# Patient Record
Sex: Male | Born: 1945 | Race: White | Hispanic: No | Marital: Married | Smoking: Never smoker
Health system: Southern US, Community
[De-identification: ages and names within clinical notes are randomized; demographics above are authoritative.]

## PROBLEM LIST (undated history)

## (undated) DIAGNOSIS — M1990 Unspecified osteoarthritis, unspecified site: Secondary | ICD-10-CM

## (undated) DIAGNOSIS — R609 Edema, unspecified: Secondary | ICD-10-CM

## (undated) DIAGNOSIS — D473 Essential (hemorrhagic) thrombocythemia: Secondary | ICD-10-CM

## (undated) DIAGNOSIS — D649 Anemia, unspecified: Secondary | ICD-10-CM

## (undated) DIAGNOSIS — H409 Unspecified glaucoma: Secondary | ICD-10-CM

## (undated) DIAGNOSIS — K219 Gastro-esophageal reflux disease without esophagitis: Secondary | ICD-10-CM

## (undated) DIAGNOSIS — C6290 Malignant neoplasm of unspecified testis, unspecified whether descended or undescended: Secondary | ICD-10-CM

## (undated) DIAGNOSIS — I10 Essential (primary) hypertension: Secondary | ICD-10-CM

## (undated) DIAGNOSIS — K2270 Barrett's esophagus without dysplasia: Secondary | ICD-10-CM

## (undated) DIAGNOSIS — R011 Cardiac murmur, unspecified: Secondary | ICD-10-CM

## (undated) HISTORY — DX: Anemia, unspecified: D64.9

## (undated) HISTORY — DX: Malignant neoplasm of unspecified testis, unspecified whether descended or undescended: C62.90

## (undated) HISTORY — DX: Unspecified glaucoma: H40.9

## (undated) HISTORY — PX: ORCHIECTOMY: SHX2116

## (undated) HISTORY — DX: Edema, unspecified: R60.9

## (undated) HISTORY — DX: Essential (hemorrhagic) thrombocythemia: D47.3

## (undated) HISTORY — PX: CHOLECYSTECTOMY: SHX55

## (undated) HISTORY — DX: Gastro-esophageal reflux disease without esophagitis: K21.9

---

## 2004-01-09 ENCOUNTER — Encounter: Payer: Self-pay | Admitting: Unknown Physician Specialty

## 2004-02-05 ENCOUNTER — Encounter: Payer: Self-pay | Admitting: Unknown Physician Specialty

## 2004-03-07 ENCOUNTER — Encounter: Payer: Self-pay | Admitting: Unknown Physician Specialty

## 2004-04-07 ENCOUNTER — Encounter: Payer: Self-pay | Admitting: Unknown Physician Specialty

## 2004-05-05 ENCOUNTER — Encounter: Payer: Self-pay | Admitting: Unknown Physician Specialty

## 2004-06-05 ENCOUNTER — Encounter: Payer: Self-pay | Admitting: Unknown Physician Specialty

## 2004-07-05 ENCOUNTER — Encounter: Payer: Self-pay | Admitting: Unknown Physician Specialty

## 2004-08-05 ENCOUNTER — Encounter: Payer: Self-pay | Admitting: Unknown Physician Specialty

## 2004-09-04 ENCOUNTER — Encounter: Payer: Self-pay | Admitting: Unknown Physician Specialty

## 2004-10-05 ENCOUNTER — Encounter: Payer: Self-pay | Admitting: Unknown Physician Specialty

## 2004-11-05 ENCOUNTER — Encounter: Payer: Self-pay | Admitting: Unknown Physician Specialty

## 2004-12-05 ENCOUNTER — Encounter: Payer: Self-pay | Admitting: Unknown Physician Specialty

## 2005-01-05 ENCOUNTER — Encounter: Payer: Self-pay | Admitting: Unknown Physician Specialty

## 2005-02-04 ENCOUNTER — Encounter: Payer: Self-pay | Admitting: Unknown Physician Specialty

## 2005-03-01 ENCOUNTER — Ambulatory Visit: Payer: Self-pay | Admitting: Unknown Physician Specialty

## 2005-03-07 ENCOUNTER — Encounter: Payer: Self-pay | Admitting: Unknown Physician Specialty

## 2005-04-07 ENCOUNTER — Encounter: Payer: Self-pay | Admitting: Unknown Physician Specialty

## 2005-05-05 ENCOUNTER — Encounter: Payer: Self-pay | Admitting: Unknown Physician Specialty

## 2005-06-05 ENCOUNTER — Encounter: Payer: Self-pay | Admitting: Unknown Physician Specialty

## 2005-07-05 ENCOUNTER — Encounter: Payer: Self-pay | Admitting: Unknown Physician Specialty

## 2005-08-05 ENCOUNTER — Encounter: Payer: Self-pay | Admitting: Unknown Physician Specialty

## 2005-09-04 ENCOUNTER — Encounter: Payer: Self-pay | Admitting: Unknown Physician Specialty

## 2006-03-08 ENCOUNTER — Ambulatory Visit: Payer: Self-pay | Admitting: Nurse Practitioner

## 2008-11-11 ENCOUNTER — Ambulatory Visit: Payer: Self-pay | Admitting: Unknown Physician Specialty

## 2010-07-29 ENCOUNTER — Ambulatory Visit: Payer: Self-pay | Admitting: Internal Medicine

## 2010-08-06 ENCOUNTER — Ambulatory Visit: Payer: Self-pay | Admitting: Internal Medicine

## 2010-09-05 ENCOUNTER — Ambulatory Visit: Payer: Self-pay | Admitting: Internal Medicine

## 2010-10-06 ENCOUNTER — Ambulatory Visit: Payer: Self-pay | Admitting: Internal Medicine

## 2010-10-15 ENCOUNTER — Ambulatory Visit: Payer: Self-pay | Admitting: Unknown Physician Specialty

## 2010-11-06 ENCOUNTER — Ambulatory Visit: Payer: Self-pay | Admitting: Internal Medicine

## 2010-12-06 ENCOUNTER — Ambulatory Visit: Payer: Self-pay | Admitting: Internal Medicine

## 2011-01-06 ENCOUNTER — Ambulatory Visit: Payer: Self-pay | Admitting: Internal Medicine

## 2011-02-05 ENCOUNTER — Ambulatory Visit: Payer: Self-pay | Admitting: Internal Medicine

## 2011-02-15 ENCOUNTER — Ambulatory Visit: Payer: Self-pay | Admitting: Internal Medicine

## 2011-03-08 ENCOUNTER — Ambulatory Visit: Payer: Self-pay | Admitting: Internal Medicine

## 2011-04-08 ENCOUNTER — Ambulatory Visit: Payer: Self-pay | Admitting: Internal Medicine

## 2011-04-13 LAB — CBC CANCER CENTER
Basophil #: 0.2 x10 3/mm — ABNORMAL HIGH (ref 0.0–0.1)
Basophil %: 3.3 %
Eosinophil #: 0.2 x10 3/mm (ref 0.0–0.7)
Eosinophil %: 2.7 %
HCT: 40.3 % (ref 40.0–52.0)
Lymphocyte #: 1.2 x10 3/mm (ref 1.0–3.6)
MCHC: 34 g/dL (ref 32.0–36.0)
MCV: 110 fL — ABNORMAL HIGH (ref 80–100)
Monocyte #: 0.7 x10 3/mm (ref 0.0–0.7)
Platelet: 427 x10 3/mm (ref 150–440)
RBC: 3.67 10*6/uL — ABNORMAL LOW (ref 4.40–5.90)
RDW: 14.6 % — ABNORMAL HIGH (ref 11.5–14.5)
WBC: 6.6 x10 3/mm (ref 3.8–10.6)

## 2011-05-06 ENCOUNTER — Ambulatory Visit: Payer: Self-pay | Admitting: Internal Medicine

## 2011-05-19 LAB — CBC CANCER CENTER
Basophil %: 0.8 %
Basophil: 1 %
Eosinophil #: 0.1 x10 3/mm (ref 0.0–0.7)
Eosinophil %: 2.5 %
HGB: 13.6 g/dL (ref 13.0–18.0)
Lymphocyte #: 0.9 x10 3/mm — ABNORMAL LOW (ref 1.0–3.6)
MCH: 37.5 pg — ABNORMAL HIGH (ref 26.0–34.0)
MCV: 109.5 fL — ABNORMAL HIGH (ref 80–100)
Monocyte #: 0.4 x10 3/mm (ref 0.0–0.7)
Monocyte %: 8.1 %
Monocytes: 10 %
Platelet: 430 x10 3/mm (ref 150–440)
RBC: 3.64 10*6/uL — ABNORMAL LOW (ref 4.40–5.90)
Segmented Neutrophils: 64 %
WBC: 4.8 x10 3/mm (ref 3.8–10.6)

## 2011-05-19 LAB — HEPATIC FUNCTION PANEL A (ARMC)
Alkaline Phosphatase: 82 U/L (ref 50–136)
Bilirubin,Total: 3.3 mg/dL — ABNORMAL HIGH (ref 0.2–1.0)
SGOT(AST): 12 U/L — ABNORMAL LOW (ref 15–37)
SGPT (ALT): 25 U/L
Total Protein: 6.5 g/dL (ref 6.4–8.2)

## 2011-06-06 ENCOUNTER — Ambulatory Visit: Payer: Self-pay | Admitting: Internal Medicine

## 2011-07-06 ENCOUNTER — Ambulatory Visit: Payer: Self-pay | Admitting: Internal Medicine

## 2011-08-11 ENCOUNTER — Ambulatory Visit: Payer: Self-pay | Admitting: Internal Medicine

## 2011-09-05 ENCOUNTER — Ambulatory Visit: Payer: Self-pay | Admitting: Internal Medicine

## 2011-10-06 ENCOUNTER — Ambulatory Visit: Payer: Self-pay | Admitting: Internal Medicine

## 2011-11-03 LAB — IRON AND TIBC
Iron Bind.Cap.(Total): 268 ug/dL (ref 250–450)
Iron: 108 ug/dL (ref 65–175)
Unbound Iron-Bind.Cap.: 160 ug/dL

## 2011-11-03 LAB — CBC CANCER CENTER
Basophil #: 0.1 x10 3/mm (ref 0.0–0.1)
Eosinophil #: 0.2 x10 3/mm (ref 0.0–0.7)
HGB: 14.5 g/dL (ref 13.0–18.0)
Lymphocyte #: 1.1 x10 3/mm (ref 1.0–3.6)
Lymphocyte %: 13.3 %
MCH: 38.3 pg — ABNORMAL HIGH (ref 26.0–34.0)
MCHC: 36.1 g/dL — ABNORMAL HIGH (ref 32.0–36.0)
MCV: 106 fL — ABNORMAL HIGH (ref 80–100)
Monocyte #: 0.7 x10 3/mm (ref 0.2–1.0)
Neutrophil %: 73.6 %
Platelet: 453 x10 3/mm — ABNORMAL HIGH (ref 150–440)
RDW: 15.1 % — ABNORMAL HIGH (ref 11.5–14.5)

## 2011-11-06 ENCOUNTER — Ambulatory Visit: Payer: Self-pay | Admitting: Internal Medicine

## 2012-04-26 ENCOUNTER — Ambulatory Visit: Payer: Self-pay | Admitting: Internal Medicine

## 2012-05-03 LAB — CBC CANCER CENTER
Eosinophil: 2 %
HCT: 38.2 % — ABNORMAL LOW (ref 40.0–52.0)
Lymphocytes: 7 %
MCH: 36.8 pg — ABNORMAL HIGH (ref 26.0–34.0)
MCHC: 35.8 g/dL (ref 32.0–36.0)
MCV: 103 fL — ABNORMAL HIGH (ref 80–100)
Platelet: 558 x10 3/mm — ABNORMAL HIGH (ref 150–440)
RDW: 15.3 % — ABNORMAL HIGH (ref 11.5–14.5)
Segmented Neutrophils: 85 %
WBC: 11.4 x10 3/mm — ABNORMAL HIGH (ref 3.8–10.6)

## 2012-05-03 LAB — IRON AND TIBC
Iron Bind.Cap.(Total): 287 ug/dL (ref 250–450)
Iron Saturation: 17 %
Unbound Iron-Bind.Cap.: 239 ug/dL

## 2012-05-03 LAB — HEPATIC FUNCTION PANEL A (ARMC)
Albumin: 4.5 g/dL (ref 3.4–5.0)
Alkaline Phosphatase: 77 U/L (ref 50–136)
SGOT(AST): 12 U/L — ABNORMAL LOW (ref 15–37)
SGPT (ALT): 21 U/L (ref 12–78)
Total Protein: 7.3 g/dL (ref 6.4–8.2)

## 2012-05-05 ENCOUNTER — Ambulatory Visit: Payer: Self-pay | Admitting: Internal Medicine

## 2012-07-18 ENCOUNTER — Ambulatory Visit: Payer: Self-pay | Admitting: Unknown Physician Specialty

## 2012-10-25 ENCOUNTER — Ambulatory Visit: Payer: Self-pay | Admitting: Internal Medicine

## 2012-10-25 LAB — CREATININE, SERUM
EGFR (African American): >60
EGFR (Non-African Amer.): >60

## 2012-10-25 LAB — CBC CANCER CENTER
Basophil #: 0.1 x10 3/mm (ref 0.0–0.1)
Basophil %: 1.2 %
Eosinophil #: 0.2 x10 3/mm (ref 0.0–0.7)
Eosinophil %: 2.1 %
HCT: 39 % — ABNORMAL LOW (ref 40.0–52.0)
HGB: 13.9 g/dL (ref 13.0–18.0)
MCH: 35.8 pg — ABNORMAL HIGH (ref 26.0–34.0)
Monocyte #: 0.9 x10 3/mm (ref 0.2–1.0)
Monocyte %: 10.2 %
Neutrophil %: 71.9 %
RBC: 3.88 10*6/uL — ABNORMAL LOW (ref 4.40–5.90)

## 2012-10-25 LAB — HEPATIC FUNCTION PANEL A (ARMC)
Bilirubin,Total: 2.5 mg/dL — ABNORMAL HIGH (ref 0.2–1.0)
SGOT(AST): 15 U/L (ref 15–37)
SGPT (ALT): 24 U/L (ref 12–78)
Total Protein: 6.7 g/dL (ref 6.4–8.2)

## 2012-10-25 LAB — IRON AND TIBC
Iron Bind.Cap.(Total): 282 ug/dL (ref 250–450)
Iron Saturation: 58 %
Iron: 164 ug/dL (ref 65–175)

## 2012-10-25 LAB — FERRITIN: Ferritin (ARMC): 248 ng/mL (ref 8–388)

## 2012-11-05 ENCOUNTER — Ambulatory Visit: Payer: Self-pay | Admitting: Internal Medicine

## 2012-12-26 ENCOUNTER — Ambulatory Visit: Payer: Self-pay | Admitting: Unknown Physician Specialty

## 2012-12-31 LAB — PATHOLOGY REPORT

## 2013-01-17 ENCOUNTER — Ambulatory Visit: Payer: Self-pay | Admitting: Internal Medicine

## 2013-01-17 LAB — CBC CANCER CENTER
Basophil %: 1.3 %
Eosinophil #: 0.2 x10 3/mm (ref 0.0–0.7)
Eosinophil %: 2.5 %
Lymphocyte #: 1.3 x10 3/mm (ref 1.0–3.6)
Lymphocyte %: 16.7 %
MCHC: 34.9 g/dL (ref 32.0–36.0)
Monocyte #: 0.7 x10 3/mm (ref 0.2–1.0)
Neutrophil #: 5.7 x10 3/mm (ref 1.4–6.5)
Platelet: 592 x10 3/mm — ABNORMAL HIGH (ref 150–440)
RBC: 3.82 10*6/uL — ABNORMAL LOW (ref 4.40–5.90)
RDW: 15.4 % — ABNORMAL HIGH (ref 11.5–14.5)

## 2013-01-17 LAB — IRON AND TIBC
Iron Bind.Cap.(Total): 272 ug/dL (ref 250–450)
Iron Saturation: 32 %
Iron: 87 ug/dL (ref 65–175)
Unbound Iron-Bind.Cap.: 185 ug/dL

## 2013-01-17 LAB — FERRITIN: Ferritin (ARMC): 251 ng/mL (ref 8–388)

## 2013-01-22 ENCOUNTER — Ambulatory Visit: Payer: Self-pay | Admitting: Unknown Physician Specialty

## 2013-02-04 ENCOUNTER — Ambulatory Visit: Payer: Self-pay | Admitting: Internal Medicine

## 2013-04-11 ENCOUNTER — Ambulatory Visit: Payer: Self-pay | Admitting: Internal Medicine

## 2013-04-11 LAB — CBC CANCER CENTER
BASOS PCT: 0.5 %
Basophil #: 0 x10 3/mm (ref 0.0–0.1)
EOS ABS: 0.2 x10 3/mm (ref 0.0–0.7)
Eosinophil %: 3.4 %
HCT: 40.7 % (ref 40.0–52.0)
HGB: 14.4 g/dL (ref 13.0–18.0)
Lymphocyte #: 1.2 x10 3/mm (ref 1.0–3.6)
Lymphocyte %: 16.1 %
MCH: 36 pg — AB (ref 26.0–34.0)
MCHC: 35.5 g/dL (ref 32.0–36.0)
MCV: 101 fL — AB (ref 80–100)
MONO ABS: 0.7 x10 3/mm (ref 0.2–1.0)
Monocyte %: 9.2 %
Neutrophil #: 5.1 x10 3/mm (ref 1.4–6.5)
Neutrophil %: 70.8 %
Platelet: 733 x10 3/mm — ABNORMAL HIGH (ref 150–440)
RBC: 4.01 10*6/uL — ABNORMAL LOW (ref 4.40–5.90)
RDW: 15.9 % — ABNORMAL HIGH (ref 11.5–14.5)
WBC: 7.2 x10 3/mm (ref 3.8–10.6)

## 2013-04-11 LAB — IRON AND TIBC
IRON BIND. CAP.(TOTAL): 301 ug/dL (ref 250–450)
IRON: 257 ug/dL — AB (ref 65–175)
Iron Saturation: 85 %
UNBOUND IRON-BIND. CAP.: 44 ug/dL

## 2013-04-11 LAB — FERRITIN: FERRITIN (ARMC): 400 ng/mL — AB (ref 8–388)

## 2013-05-05 ENCOUNTER — Ambulatory Visit: Payer: Self-pay | Admitting: Internal Medicine

## 2013-05-23 LAB — CBC CANCER CENTER
BASOS PCT: 1.5 %
Basophil #: 0.1 x10 3/mm (ref 0.0–0.1)
EOS PCT: 2.3 %
Eosinophil #: 0.2 x10 3/mm (ref 0.0–0.7)
HCT: 40.5 % (ref 40.0–52.0)
HGB: 14.3 g/dL (ref 13.0–18.0)
LYMPHS ABS: 1.2 x10 3/mm (ref 1.0–3.6)
LYMPHS PCT: 13.1 %
MCH: 36.1 pg — AB (ref 26.0–34.0)
MCHC: 35.4 g/dL (ref 32.0–36.0)
MCV: 102 fL — ABNORMAL HIGH (ref 80–100)
MONO ABS: 0.8 x10 3/mm (ref 0.2–1.0)
Monocyte %: 9 %
NEUTROS ABS: 6.8 x10 3/mm — AB (ref 1.4–6.5)
Neutrophil %: 74.1 %
Platelet: 756 x10 3/mm — ABNORMAL HIGH (ref 150–440)
RBC: 3.96 10*6/uL — AB (ref 4.40–5.90)
RDW: 17.1 % — AB (ref 11.5–14.5)
WBC: 9.2 x10 3/mm (ref 3.8–10.6)

## 2013-06-05 ENCOUNTER — Ambulatory Visit: Payer: Self-pay | Admitting: Internal Medicine

## 2013-10-04 ENCOUNTER — Ambulatory Visit: Payer: Self-pay | Admitting: Physical Medicine and Rehabilitation

## 2013-10-10 ENCOUNTER — Ambulatory Visit: Payer: Self-pay | Admitting: Internal Medicine

## 2013-10-23 LAB — CBC CANCER CENTER
Basophil: 2 %
EOS PCT: 4 %
HCT: 39.7 % — ABNORMAL LOW (ref 40.0–52.0)
HGB: 14.1 g/dL (ref 13.0–18.0)
Lymphocytes: 15 %
MCH: 36.3 pg — AB (ref 26.0–34.0)
MCHC: 35.6 g/dL (ref 32.0–36.0)
MCV: 102 fL — AB (ref 80–100)
Monocytes: 7 %
Platelet: 637 x10 3/mm — ABNORMAL HIGH (ref 150–440)
RBC: 3.9 10*6/uL — ABNORMAL LOW (ref 4.40–5.90)
RDW: 15.4 % — AB (ref 11.5–14.5)
Segmented Neutrophils: 72 %
WBC: 8 x10 3/mm (ref 3.8–10.6)

## 2013-10-23 LAB — RETICULOCYTES
ABSOLUTE RETIC COUNT: 0.1873 10*6/uL — AB (ref 0.019–0.186)
RETICULOCYTE: 4.8 % — AB (ref 0.4–3.1)

## 2013-11-05 ENCOUNTER — Ambulatory Visit: Payer: Self-pay | Admitting: Internal Medicine

## 2014-03-13 ENCOUNTER — Ambulatory Visit: Payer: Self-pay | Admitting: Internal Medicine

## 2014-07-22 ENCOUNTER — Telehealth: Payer: Self-pay | Admitting: Internal Medicine

## 2014-07-22 NOTE — Telephone Encounter (Signed)
Patient says platelet count was very high at last checking, 10.28 as of two weeks ago. Before that it was 7.70. He would like an appt to see Pandit. Please advise. [phone].

## 2014-07-23 NOTE — Telephone Encounter (Signed)
Called pt back and he states at his PCP appt labs 2 weeks ago showed plt count 1028.  His PCP wanted him to be seen due the plt inc.  Pt also states that potassium levels have been elevated 5.7 2 weeks ago and 6 months ago 5.8 and PCP told him there was med to help with it but did not prescribe any.  Gave pt appt for Thursday in [place] because that is where pt comes to on his visits and told him 9:30

## 2014-07-24 ENCOUNTER — Inpatient Hospital Stay (HOSPITAL_BASED_OUTPATIENT_CLINIC_OR_DEPARTMENT_OTHER): Payer: Medicare Other | Admitting: Internal Medicine

## 2014-07-24 ENCOUNTER — Other Ambulatory Visit: Payer: Self-pay | Admitting: *Deleted

## 2014-07-24 ENCOUNTER — Inpatient Hospital Stay: Payer: Medicare Other | Attending: Internal Medicine

## 2014-07-24 ENCOUNTER — Encounter: Payer: Self-pay | Admitting: Internal Medicine

## 2014-07-24 VITALS — BP 168/52 | HR 82 | Temp 96.7°F | Resp 18 | Ht 65.0 in | Wt 167.3 lb

## 2014-07-24 DIAGNOSIS — K219 Gastro-esophageal reflux disease without esophagitis: Secondary | ICD-10-CM | POA: Insufficient documentation

## 2014-07-24 DIAGNOSIS — D473 Essential (hemorrhagic) thrombocythemia: Secondary | ICD-10-CM | POA: Insufficient documentation

## 2014-07-24 DIAGNOSIS — Z79899 Other long term (current) drug therapy: Secondary | ICD-10-CM

## 2014-07-24 DIAGNOSIS — D510 Vitamin B12 deficiency anemia due to intrinsic factor deficiency: Secondary | ICD-10-CM | POA: Insufficient documentation

## 2014-07-24 DIAGNOSIS — M8580 Other specified disorders of bone density and structure, unspecified site: Secondary | ICD-10-CM

## 2014-07-24 DIAGNOSIS — Z8547 Personal history of malignant neoplasm of testis: Secondary | ICD-10-CM | POA: Insufficient documentation

## 2014-07-24 DIAGNOSIS — M545 Low back pain: Secondary | ICD-10-CM | POA: Diagnosis not present

## 2014-07-24 LAB — IRON AND TIBC
IRON: 145 ug/dL (ref 45–182)
Saturation Ratios: 56 % — ABNORMAL HIGH (ref 17.9–39.5)
TIBC: 259 ug/dL (ref 250–450)
UIBC: 114 ug/dL

## 2014-07-24 LAB — HEPATIC FUNCTION PANEL
ALK PHOS: 51 U/L (ref 38–126)
ALT: 14 U/L — AB (ref 17–63)
AST: 14 U/L — AB (ref 15–41)
Albumin: 4.6 g/dL (ref 3.5–5.0)
BILIRUBIN DIRECT: 0.2 mg/dL (ref 0.1–0.5)
Indirect Bilirubin: 2.3 mg/dL — ABNORMAL HIGH (ref 0.3–0.9)
Total Bilirubin: 2.5 mg/dL — ABNORMAL HIGH (ref 0.3–1.2)
Total Protein: 6.8 g/dL (ref 6.5–8.1)

## 2014-07-24 LAB — CBC WITH DIFFERENTIAL/PLATELET
Basophils Absolute: 0.1 10*3/uL (ref 0–0.1)
Basophils Relative: 1 %
EOS ABS: 0.3 10*3/uL (ref 0–0.7)
Eosinophils Relative: 3 %
HCT: 41.8 % (ref 40.0–52.0)
Hemoglobin: 14.9 g/dL (ref 13.0–18.0)
LYMPHS ABS: 1.3 10*3/uL (ref 1.0–3.6)
Lymphocytes Relative: 13 %
MCH: 36.1 pg — AB (ref 26.0–34.0)
MCHC: 35.8 g/dL (ref 32.0–36.0)
MCV: 101 fL — ABNORMAL HIGH (ref 80.0–100.0)
Monocytes Absolute: 1 10*3/uL (ref 0.2–1.0)
Monocytes Relative: 10 %
NEUTROS ABS: 7.8 10*3/uL — AB (ref 1.4–6.5)
Neutrophils Relative %: 73 %
PLATELETS: 885 10*3/uL — AB (ref 150–440)
RBC: 4.14 MIL/uL — AB (ref 4.40–5.90)
RDW: 14.6 % — ABNORMAL HIGH (ref 11.5–14.5)
WBC: 10.6 10*3/uL (ref 3.8–10.6)

## 2014-07-24 LAB — CREATININE, SERUM
CREATININE: 0.78 mg/dL (ref 0.61–1.24)
GFR calc Af Amer: >60 mL/min (ref >60)
GFR calc non Af Amer: >60 mL/min (ref >60)

## 2014-07-24 LAB — VITAMIN B12: Vitamin B-12: 894 pg/mL (ref 180–914)

## 2014-07-24 LAB — FERRITIN: FERRITIN: 373 ng/mL — AB (ref 24–336)

## 2014-07-24 NOTE — Progress Notes (Unsigned)
Pt been having sinus infections, off and on with occ. Coughing up colored sputum, no temp. On atb from PCP.

## 2014-07-31 ENCOUNTER — Inpatient Hospital Stay: Payer: Medicare Other

## 2014-07-31 DIAGNOSIS — D473 Essential (hemorrhagic) thrombocythemia: Secondary | ICD-10-CM

## 2014-07-31 LAB — CBC WITH DIFFERENTIAL/PLATELET
BASOS ABS: 0 10*3/uL (ref 0–0.1)
Basophils Relative: 0 %
Eosinophils Absolute: 0.2 10*3/uL (ref 0–0.7)
Eosinophils Relative: 3 %
HCT: 40.5 % (ref 40.0–52.0)
Hemoglobin: 14.6 g/dL (ref 13.0–18.0)
Lymphocytes Relative: 16 %
Lymphs Abs: 1.2 10*3/uL (ref 1.0–3.6)
MCH: 36.6 pg — ABNORMAL HIGH (ref 26.0–34.0)
MCHC: 36 g/dL (ref 32.0–36.0)
MCV: 101.6 fL — ABNORMAL HIGH (ref 80.0–100.0)
MONO ABS: 0.7 10*3/uL (ref 0.2–1.0)
Monocytes Relative: 10 %
Neutro Abs: 5.2 10*3/uL (ref 1.4–6.5)
Platelets: 771 10*3/uL — ABNORMAL HIGH (ref 150–440)
RBC: 3.98 MIL/uL — ABNORMAL LOW (ref 4.40–5.90)
RDW: 15.5 % — ABNORMAL HIGH (ref 11.5–14.5)
WBC: 7.3 10*3/uL (ref 3.8–10.6)

## 2014-08-03 NOTE — Progress Notes (Signed)
[place]  [phone] [phone]     ID: Drew Gardner OB: April 25, 1945  MR#: [id number]  AJG#:[id number]  No care team member to display  CHIEF COMPLAINT/DIAGNOSIS:  1. Macrocytic Anemia, reticulocytosis -  workup showed B12 deficiency/pernicious anemia, otherwise unremarkable. Aug 2014 - [facility] Flow Cytometry study negative.    Workup done May 2012 -  Hb 13, plts 347K, WBC 7200 with 69% neutrophils, 1% myelocytes, 1% metamyelocytes, ab-retic 199. Serum B12 low at 201. Stool Hemoccult negative x2 Liver functions unremarkable except bili of 2.2. Iron study, ESR, LDH, haptoglobin, folate, SIEP, ANA, TSH, T4, direct Coombs' test all unremarkable. June 2012 -  serum Intrinsic factor antibody positive. November 2012 - serum LDH normal, direct Coombs negative.  Haptoglobin low at 33. May 2014 - ultrasound abdomen reported hepatic cysts, prominent spleen of 13.82 cm.  2. 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 10/04/13 - MRI lumbar spine done for low back pain reports extensive marrow signal abnormality worrisome for marrow disorder, along with disc disease. Bone marrow biopsy Aug 2015 myeloproliferative disorder.  Platelet count risen to 1028K on 07/01/14. Patient referred back here for hematology evaluation.              HISTORY OF PRESENT ILLNESS:  patient returns for hematology evaluation , he was recently found to have significantly elevated platelet count from baseline at 1028K on 07/01/14. States he had upper respiratory tract infection about 3-4 weeks ago and took oral antibiotics and  symptoms have almost resolved. Otherwise no new complaints. He is on oral B12. No new dyspnea or orthopnea. Appetite and weight are steady. Chronic back pain is same, denies other new bone pains.  No new paresthesias in extremities. No fevers, chills or night sweats. Denies any bleeding symptoms. Denies h/o thromboembolic  phenomena, states he remains physically active.  REVIEW OF SYSTEMS:   ROS As in HPI above. In addition, no fever, chills or sweats. No new headaches or focal weakness.  No new mood disturbances. No sore throat or dysphagia. No chest pain, dizziness or palpitation. No abdominal pain, constipation, diarrhea, dysuria or hematuria. No new skin rash or bleeding symptoms. No new paresthesias in extremities. No polyuria polydipsia.  PAST MEDICAL HISTORY: Past Medical History  Diagnosis Date  . Anemia   . GERD (gastroesophageal reflux disease)   . Glaucoma   . Testicular cancer     when patient ws 69 years old  . Dependent edema     chronic  . Essential thrombocytosis          Gastroesophageal reflux disease  Chronic dependent edema  History of testicular cancer at age 26 status post right orchiectomy and radiation  History of pernicious anemia currently not on B12 therapy  Glaucoma  Flow murmur  Reported h/o hemochromatosis (per patient, iron levels were high in past and got phlebotomy by GI for months and                   was stopped)             Essential Thrombocytosis, CALR mutation positive                 PAST SURGICAL HISTORY: Past Surgical History  Procedure Laterality Date  . Orchiectomy Right     when he was 69 years old   FAMILY HISTORY: noncontributory, denies malignancy or hematological disorders  ADVANCED DIRECTIVES:   SOCIAL HISTORY: History  Substance Use Topics  .  Smoking status: Never Smoker   . Smokeless tobacco: Never Used  . Alcohol Use: 0.6 oz/week    1 Cans of beer per week     Comment: every once a while not an every week event  Denies history of smoking.  Drinks couple of beers on and off, no regular alcohol intake.  Physically active and ambulatory.  No Known Allergies  Current Outpatient Prescriptions  Medication Sig Dispense Refill  . amoxicillin-clavulanate (AUGMENTIN) 875-125 MG per tablet Take 1 tablet by mouth 2 (two) times daily.    .  hydrochlorothiazide (HYDRODIURIL) 25 MG tablet Take 25 mg by mouth daily.    Marland Kitchen lactobacillus acidophilus (BACID) TABS tablet Take 2 tablets by mouth 3 (three) times daily.    Marland Kitchen latanoprost (XALATAN) 0.005 % ophthalmic solution Place 1 drop into both eyes 2 (two) times daily.    . pantoprazole (PROTONIX) 40 MG tablet Take 40 mg by mouth daily.    . Probiotic Product (PROBIOTIC DAILY PO) Take 1 Dose by mouth daily.    . vitamin B-12 (CYANOCOBALAMIN) 1000 MCG tablet Take 1,000 mcg by mouth daily.    . hydroxyurea (HYDREA) 500 MG capsule Take 500 mg by mouth daily. May take with food to minimize GI side effects.     No current facility-administered medications for this visit.    OBJECTIVE: Filed Vitals:   07/24/14 0944  BP: 168/52  Pulse: 82  Temp: 96.7 F (35.9 C)  Resp: 18     Body mass index is 27.85 kg/(m^2).      GENERAL: Patient is alert and oriented and in no acute distress. There is no icterus. HEENT: EOMs intact. No cervical lymphadenopathy. CVS: S1S2, regular LUNGS: Bilaterally clear to auscultation, no rhonchi. ABDOMEN: Soft, nontender. No hepatosplenomegaly clinically.  NEURO: grossly nonfocal, cranial nerves are intact.   EXTREMITIES: No pedal edema. LYMPHATICS: No palpable adenopathy in axillary or inguinal areas.   LAB RESULTS: WBC 10.6, Hb 14.9, platelets 885K, ANC 7.8, serum iron 145, TIBC 259, ferritin 373, Cr 0.78, LFT as below.    Component Value Date/Time   CREATININE 0.78 07/24/2014 0933   CREATININE 0.90 10/25/2012 1124   PROT 6.8 07/24/2014 0933   PROT 6.7 10/25/2012 1124   ALBUMIN 4.6 07/24/2014 0933   ALBUMIN 4.2 10/25/2012 1124   AST 14* 07/24/2014 0933   AST 15 10/25/2012 1124   ALT 14* 07/24/2014 0933   ALT 24 10/25/2012 1124   ALKPHOS 51 07/24/2014 0933   ALKPHOS 68 10/25/2012 1124   BILITOT 2.5* 07/24/2014 0933   GFRNONAA >60 07/24/2014 0933   GFRNONAA >60 10/25/2012 1124   GFRAA >60 07/24/2014 0933   GFRAA >60 10/25/2012 1124   04/11/13 -  serum B12 normal at 768.  10/04/13 - MRI lumbar spine. IMPRESSION: Extensive marrow signal abnormality is worrisome for multiple myeloma or marrow infiltrative process. Moderate central canal narrowing with narrowing in the right lateral recess and foramen at L3-4 due to a disc bulge, ligamentum flavum thickening and a small synovial cyst off the right facet joint. Moderate central canal stenosis and marked narrowing in the left lateral recess at L4-5 due to disc bulging and facet degenerative change.  07/01/14 - Platelet count risen to 1028K, Hb 15.4, WBC 9700, 50% neutros, 24% lymphs, smudge cells present. Cr 0.7, K+ 5.7.   ASSESSMENT / PLAN:   1. Essential Thrombocytosis, CALR mutation positive -   Prior platelet count mostly 650-760 K. No h/o Thromboembolic phenomena. Platelet count risen to 1028K on 07/01/14.  This could be partly from recent URTI, however, repeat platelet count today is still remarkable high at 885K despite improvement of URTI symptoms. Given this, have recommended pursuing low-dose Hydrea to control platelet count better and explained rationale, possible benefits and possible side effects of Hydrea, he is agreeable and expressed verbal consent to take this treatment. Will start Hydrea 500 mg once daily and closely monitor blood counts with CBC q weekly, intermittent met-B/LFT. Will see him back at 5 weeks with labs and make continued treatment planning. Have recommended taking ECASA 81 mg daily. 2. Pernicious Anemia with reticulocytosis, mild hyperbilirubinemia with negative hemolytic workup -  he continues to have persistent minor abnormalities including slightly elevated serum bilirubin (mostly indirect), mild reticulocytosis, prominent spleen on recent ultrasound of 13.8 cm, low haptoglobin in the past, raising possibility of unexplained low-grade hemolysis.  Recent direct Coombs test and LDH were unremarkable. [facility] flow cytometry done in Aug 2014 also negative. In February 2014 he  had evidence of iron deficiency with low serum iron of 48 and low iron saturation of 17%. Currently on oral B12.  3. Prior MRI lumbar spine done for low back pain reports extensive marrow signal abnormality worrisome for marrow disorder, along with disc disease - marrow signal abnormalities likely from (essential thrombocytosis). 4. In between visits, patient advised to call in case of any new symptoms or acute sickness. He is agreeable to this plan.   Leia Alf, MD   08/03/2014 11:01 AM

## 2014-08-07 ENCOUNTER — Inpatient Hospital Stay: Payer: Medicare Other | Attending: Internal Medicine

## 2014-08-07 DIAGNOSIS — M8580 Other specified disorders of bone density and structure, unspecified site: Secondary | ICD-10-CM | POA: Insufficient documentation

## 2014-08-07 DIAGNOSIS — K219 Gastro-esophageal reflux disease without esophagitis: Secondary | ICD-10-CM | POA: Diagnosis not present

## 2014-08-07 DIAGNOSIS — D510 Vitamin B12 deficiency anemia due to intrinsic factor deficiency: Secondary | ICD-10-CM | POA: Insufficient documentation

## 2014-08-07 DIAGNOSIS — D473 Essential (hemorrhagic) thrombocythemia: Secondary | ICD-10-CM | POA: Diagnosis present

## 2014-08-07 DIAGNOSIS — Z8547 Personal history of malignant neoplasm of testis: Secondary | ICD-10-CM | POA: Insufficient documentation

## 2014-08-07 DIAGNOSIS — D539 Nutritional anemia, unspecified: Secondary | ICD-10-CM | POA: Insufficient documentation

## 2014-08-07 DIAGNOSIS — Z79899 Other long term (current) drug therapy: Secondary | ICD-10-CM | POA: Insufficient documentation

## 2014-08-07 LAB — CBC WITH DIFFERENTIAL/PLATELET
BASOS ABS: 0 10*3/uL (ref 0–0.1)
Basophils Relative: 0 %
Eosinophils Absolute: 0.3 10*3/uL (ref 0–0.7)
HCT: 40.3 % (ref 40.0–52.0)
Hemoglobin: 14.8 g/dL (ref 13.0–18.0)
Lymphs Abs: 1.1 10*3/uL (ref 1.0–3.6)
MCH: 37.4 pg — AB (ref 26.0–34.0)
MCHC: 36.6 g/dL — ABNORMAL HIGH (ref 32.0–36.0)
MCV: 102.2 fL — ABNORMAL HIGH (ref 80.0–100.0)
Monocytes Absolute: 0.7 10*3/uL (ref 0.2–1.0)
Monocytes Relative: 9 %
Neutro Abs: 5.7 10*3/uL (ref 1.4–6.5)
Neutrophils Relative %: 74 %
Platelets: 758 10*3/uL — ABNORMAL HIGH (ref 150–440)
RBC: 3.95 MIL/uL — ABNORMAL LOW (ref 4.40–5.90)
RDW: 16.6 % — ABNORMAL HIGH (ref 11.5–14.5)
WBC: 7.8 10*3/uL (ref 3.8–10.6)

## 2014-08-14 ENCOUNTER — Inpatient Hospital Stay: Payer: Medicare Other

## 2014-08-14 DIAGNOSIS — D473 Essential (hemorrhagic) thrombocythemia: Secondary | ICD-10-CM

## 2014-08-14 LAB — CBC WITH DIFFERENTIAL/PLATELET
BASOS ABS: 0.1 10*3/uL (ref 0–0.1)
Basophils Relative: 2 %
EOS ABS: 0.2 10*3/uL (ref 0–0.7)
HEMATOCRIT: 43 % (ref 40.0–52.0)
Hemoglobin: 15.6 g/dL (ref 13.0–18.0)
LYMPHS ABS: 1.3 10*3/uL (ref 1.0–3.6)
Lymphocytes Relative: 15 %
MCH: 37.7 pg — ABNORMAL HIGH (ref 26.0–34.0)
MCHC: 36.3 g/dL — ABNORMAL HIGH (ref 32.0–36.0)
MCV: 104 fL — ABNORMAL HIGH (ref 80.0–100.0)
MONO ABS: 1 10*3/uL (ref 0.2–1.0)
Monocytes Relative: 11 %
Neutro Abs: 6 10*3/uL (ref 1.4–6.5)
Neutrophils Relative %: 69 %
Platelets: 803 10*3/uL — ABNORMAL HIGH (ref 150–440)
RBC: 4.13 MIL/uL — AB (ref 4.40–5.90)
RDW: 16.9 % — AB (ref 11.5–14.5)
WBC: 8.7 10*3/uL (ref 3.8–10.6)

## 2014-08-14 LAB — POTASSIUM: Potassium: 3.5 mmol/L (ref 3.5–5.1)

## 2014-08-21 ENCOUNTER — Inpatient Hospital Stay: Payer: Medicare Other

## 2014-08-28 ENCOUNTER — Inpatient Hospital Stay: Payer: Medicare Other

## 2014-08-28 ENCOUNTER — Inpatient Hospital Stay (HOSPITAL_BASED_OUTPATIENT_CLINIC_OR_DEPARTMENT_OTHER): Payer: Medicare Other | Admitting: Internal Medicine

## 2014-08-28 VITALS — BP 135/63 | HR 67 | Temp 96.8°F | Resp 18 | Ht 65.0 in | Wt 167.8 lb

## 2014-08-28 DIAGNOSIS — M8580 Other specified disorders of bone density and structure, unspecified site: Secondary | ICD-10-CM | POA: Diagnosis not present

## 2014-08-28 DIAGNOSIS — D539 Nutritional anemia, unspecified: Secondary | ICD-10-CM

## 2014-08-28 DIAGNOSIS — K219 Gastro-esophageal reflux disease without esophagitis: Secondary | ICD-10-CM

## 2014-08-28 DIAGNOSIS — D473 Essential (hemorrhagic) thrombocythemia: Secondary | ICD-10-CM

## 2014-08-28 DIAGNOSIS — D510 Vitamin B12 deficiency anemia due to intrinsic factor deficiency: Secondary | ICD-10-CM

## 2014-08-28 DIAGNOSIS — Z79899 Other long term (current) drug therapy: Secondary | ICD-10-CM

## 2014-08-28 DIAGNOSIS — Z8547 Personal history of malignant neoplasm of testis: Secondary | ICD-10-CM

## 2014-08-28 LAB — HEPATIC FUNCTION PANEL
ALBUMIN: 4.5 g/dL (ref 3.5–5.0)
ALK PHOS: 48 U/L (ref 38–126)
ALT: 21 U/L (ref 17–63)
AST: 16 U/L (ref 15–41)
Bilirubin, Direct: 0.2 mg/dL (ref 0.1–0.5)
Indirect Bilirubin: 3.3 mg/dL — ABNORMAL HIGH (ref 0.3–0.9)
TOTAL PROTEIN: 6.7 g/dL (ref 6.5–8.1)
Total Bilirubin: 3.5 mg/dL — ABNORMAL HIGH (ref 0.3–1.2)

## 2014-08-28 LAB — CBC WITH DIFFERENTIAL/PLATELET
Basophils Absolute: 0.2 10*3/uL — ABNORMAL HIGH (ref 0–0.1)
Basophils Relative: 3 %
EOS ABS: 0.2 10*3/uL (ref 0–0.7)
HCT: 42.3 % (ref 40.0–52.0)
HEMOGLOBIN: 15.1 g/dL (ref 13.0–18.0)
Lymphs Abs: 0.9 10*3/uL — ABNORMAL LOW (ref 1.0–3.6)
MCH: 38.5 pg — AB (ref 26.0–34.0)
MCHC: 35.8 g/dL (ref 32.0–36.0)
MCV: 107.6 fL — AB (ref 80.0–100.0)
Monocytes Absolute: 0.7 10*3/uL (ref 0.2–1.0)
Monocytes Relative: 12 %
Neutro Abs: 3.8 10*3/uL (ref 1.4–6.5)
PLATELETS: 560 10*3/uL — AB (ref 150–440)
RBC: 3.93 MIL/uL — ABNORMAL LOW (ref 4.40–5.90)
RDW: 18.5 % — ABNORMAL HIGH (ref 11.5–14.5)
WBC: 5.8 10*3/uL (ref 3.8–10.6)

## 2014-08-28 LAB — CREATININE, SERUM
Creatinine, Ser: 0.77 mg/dL (ref 0.61–1.24)
GFR calc non Af Amer: >60 mL/min (ref >60)

## 2014-08-28 LAB — POTASSIUM: Potassium: 3.9 mmol/L (ref 3.5–5.1)

## 2014-09-01 NOTE — Progress Notes (Signed)
[facility]  Telephone:(336) [phone] Fax:(336) [phone]     ID: Drew Gardner OB: 1945-11-01  MR#: [id number]  HFW#:[id number]  No care team member to display  CHIEF COMPLAINT/DIAGNOSIS:  1. Macrocytic Anemia, reticulocytosis -  workup showed B12 deficiency/pernicious anemia, otherwise unremarkable. Aug 2014 - [facility] Flow Cytometry study negative.    Workup done May 2012 -  Hb 13, plts 347K, WBC 7200 with 69% neutrophils, 1% myelocytes, 1% metamyelocytes, ab-retic 199. Serum B12 low at 201. Stool Hemoccult negative x2 Liver functions unremarkable except bili of 2.2. Iron study, ESR, LDH, haptoglobin, folate, SIEP, ANA, TSH, T4, direct Coombs' test all unremarkable. June 2012 -  serum Intrinsic factor antibody positive. November 2012 - serum LDH normal, direct Coombs negative.  Haptoglobin low at 33. May 2014 - ultrasound abdomen reported hepatic cysts, prominent spleen of 13.82 cm.  2. Essential Thrombocytosis ([id number]), CALR mutation positive - asymptomatic, on monitoring. 10/04/13 - MRI lumbar spine done for low back pain reports extensive marrow signal abnormality worrisome for marrow disorder, along with disc disease. Bone marrow biopsy Aug 2015 myeloproliferative disorder.  Platelet count risen to 1028K on 07/01/14. Patient referred back here for hematology evaluation.              HISTORY OF PRESENT ILLNESS:  Patient returns for continued hematology followup, he was recently started on Hydrea. States he is tolerating this well and denies new side effects. No mouth sores. No diarrhea. No imbalance or dizziness. Otherwise no new complaints. He is on oral B12. No new dyspnea or orthopnea. Appetite and weight are steady. Chronic back pain is same, denies other new bone pains. No fevers, chills or night sweats. Denies any bleeding symptoms. Denies h/o thromboembolic phenomena, states he remains physically active.  REVIEW OF SYSTEMS:   ROS As in HPI above. In addition, no new  headaches or focal weakness.  No new mood disturbances. No sore throat or dysphagia. No chest pain, dizziness or palpitation. No abdominal pain, constipation, diarrhea, dysuria or hematuria. No new skin rash or bleeding symptoms. No new paresthesias in extremities.   PAST MEDICAL HISTORY: Past Medical History  Diagnosis Date  . Anemia   . GERD (gastroesophageal reflux disease)   . Glaucoma   . Testicular cancer     when patient ws 69 years old  . Dependent edema     chronic  . Essential thrombocytosis          Gastroesophageal reflux disease  Chronic dependent edema  History of testicular cancer at age 40 status post right orchiectomy and radiation  History of pernicious anemia currently not on B12 therapy  Glaucoma  Flow murmur  Reported h/o hemochromatosis (per patient, iron levels high in past, got phlebotomy by GI for months and was stopped)             Essential Thrombocytosis, CALR mutation positive                 PAST SURGICAL HISTORY: Past Surgical History  Procedure Laterality Date  . Orchiectomy Right     when he was 69 years old   FAMILY HISTORY: noncontributory, denies malignancy or hematological disorders  SOCIAL HISTORY: History  Substance Use Topics  . Smoking status: Never Smoker   . Smokeless tobacco: Never Used  . Alcohol Use: 0.6 oz/week    1 Cans of beer per week     Comment: every once a while not an every week event  Denies history of smoking.  Drinks  couple of beers on and off, no regular alcohol intake.  Physically active and ambulatory.  No Known Allergies  Current Outpatient Prescriptions  Medication Sig Dispense Refill  . hydrochlorothiazide (HYDRODIURIL) 25 MG tablet Take 25 mg by mouth daily.    . hydroxyurea (HYDREA) 500 MG capsule Take 500 mg by mouth every other day. May take with food to minimize GI side effects.    . hydroxyurea (HYDREA) 500 MG capsule Take 500 mg by mouth 2 (two) times daily. Every other day.    . lactobacillus  acidophilus (BACID) TABS tablet Take 2 tablets by mouth 3 (three) times daily.    Marland Kitchen latanoprost (XALATAN) 0.005 % ophthalmic solution Place 1 drop into both eyes 2 (two) times daily.    . pantoprazole (PROTONIX) 40 MG tablet Take 40 mg by mouth daily.    . Probiotic Product (PROBIOTIC DAILY PO) Take 1 Dose by mouth daily.    . vitamin B-12 (CYANOCOBALAMIN) 1000 MCG tablet Take 1,000 mcg by mouth daily.    Marland Kitchen amoxicillin-clavulanate (AUGMENTIN) 875-125 MG per tablet Take 1 tablet by mouth 2 (two) times daily.    . hydroxyurea (HYDREA) 500 MG capsule Take 500 mg by mouth daily. May take with food to minimize GI side effects.     No current facility-administered medications for this visit.    OBJECTIVE: Filed Vitals:   08/28/14 1016  BP: 135/63  Pulse: 67  Temp: 96.8 F (36 C)  Resp: 18     Body mass index is 27.92 kg/(m^2).      GENERAL: Alert and oriented and in no acute distress. No icterus. HEENT: EOMs intact. No cervical lymphadenopathy. CVS: S1S2, regular LUNGS: Bilaterally clear to auscultation, no rhonchi. ABDOMEN: Soft, nontender. No hepatosplenomegaly clinically.  NEURO: grossly nonfocal, cranial nerves are intact.   EXTREMITIES: No pedal edema.   LAB RESULTS: WBC 5.8, Hb 15.1, Hct 42.3, platelets 560K, ANC 3.8. May 2016 - .iron sat 56%, serum iron 145, TIBC 259, ferritin 373, Cr 0.78, B12 894, LFT as below.    Component Value Date/Time   K 3.9 08/28/2014 1004   CREATININE 0.77 08/28/2014 1004   CREATININE 0.90 10/25/2012 1124   PROT 6.7 08/28/2014 1004   PROT 6.7 10/25/2012 1124   ALBUMIN 4.5 08/28/2014 1004   ALBUMIN 4.2 10/25/2012 1124   AST 16 08/28/2014 1004   AST 15 10/25/2012 1124   ALT 21 08/28/2014 1004   ALT 24 10/25/2012 1124   ALKPHOS 48 08/28/2014 1004   ALKPHOS 68 10/25/2012 1124   BILITOT 3.5* 08/28/2014 1004   GFRNONAA >60 08/28/2014 1004   GFRNONAA >60 10/25/2012 1124   GFRAA >60 08/28/2014 1004   GFRAA >60 10/25/2012 1124   04/11/13 - serum  B12 normal at 768.  10/04/13 - MRI lumbar spine. IMPRESSION: Extensive marrow signal abnormality is worrisome for multiple myeloma or marrow infiltrative process. Moderate central canal narrowing with narrowing in the right lateral recess and foramen at L3-4 due to a disc bulge, ligamentum flavum thickening and a small synovial cyst off the right facet joint. Moderate central canal stenosis and marked narrowing in the left lateral recess at L4-5 due to disc bulging and facet degenerative change.  07/01/14 - Platelet count risen to 1028K, Hb 15.4, WBC 9700, 50% neutros, 24% lymphs, smudge cells present. Cr 0.7, K+ 5.7.   ASSESSMENT / PLAN:   1. Essential Thrombocytosis, CALR mutation positive. Started on [facility] May 2016 - reviewed labs and d/w patient. He is tolerating Hydrea dose well.Platelet  count better at 560K today. Will continue to titrate dose of Hydrea to keep platelet count close to or within normal range. No h/o Thromboembolic phenomena. Will monitor CBC q 2 weeks, intermittent Cr and LFTs. Will see him back at 12 weeks with labs and make continued treatment planning. Have recommended taking ECASA 81 mg daily. 2. Pernicious Anemia with reticulocytosis, mild hyperbilirubinemia with negative hemolytic workup -  he continues to have persistent minor abnormalities including slightly elevated serum bilirubin (mostly indirect), mild reticulocytosis, prominent spleen on recent ultrasound of 13.8 cm, low haptoglobin in the past, raising possibility of unexplained low-grade hemolysis.  Recent direct Coombs test and LDH were unremarkable. [facility] flow cytometry done in Aug 2014 also negative. In February 2014 he had evidence of iron deficiency with low serum iron of 48 and low iron saturation of 17%. Currently on oral B12, recent serum B12 normal.  3. Prior MRI lumbar spine done for low back pain reports extensive marrow signal abnormality worrisome for marrow disorder, along with disc disease - marrow signal  abnormalities likely from (essential thrombocytosis). 4. In between visits, patient advised to call in case of any new symptoms or acute sickness. He is agreeable to this plan.   Leia Alf, MD   09/01/2014 12:37 PM

## 2014-09-11 ENCOUNTER — Inpatient Hospital Stay: Payer: Medicare Other | Attending: Internal Medicine

## 2014-09-11 DIAGNOSIS — D473 Essential (hemorrhagic) thrombocythemia: Secondary | ICD-10-CM | POA: Diagnosis not present

## 2014-09-11 LAB — CBC WITH DIFFERENTIAL/PLATELET
Basophils Absolute: 0 10*3/uL (ref 0–0.1)
Eosinophils Absolute: 0.2 10*3/uL (ref 0–0.7)
Eosinophils Relative: 3 %
HCT: 41.2 % (ref 40.0–52.0)
Hemoglobin: 15 g/dL (ref 13.0–18.0)
Lymphs Abs: 0.9 10*3/uL — ABNORMAL LOW (ref 1.0–3.6)
MCH: 39.9 pg — AB (ref 26.0–34.0)
MCHC: 36.4 g/dL — AB (ref 32.0–36.0)
MCV: 109.5 fL — AB (ref 80.0–100.0)
Monocytes Absolute: 0.6 10*3/uL (ref 0.2–1.0)
Neutro Abs: 4.7 10*3/uL (ref 1.4–6.5)
Platelets: 528 10*3/uL — ABNORMAL HIGH (ref 150–440)
RBC: 3.76 MIL/uL — ABNORMAL LOW (ref 4.40–5.90)
RDW: 17.2 % — ABNORMAL HIGH (ref 11.5–14.5)
WBC: 6.5 10*3/uL (ref 3.8–10.6)

## 2014-09-11 LAB — HEPATIC FUNCTION PANEL
ALK PHOS: 45 U/L (ref 38–126)
ALT: 22 U/L (ref 17–63)
AST: 19 U/L (ref 15–41)
Albumin: 4.5 g/dL (ref 3.5–5.0)
Bilirubin, Direct: 0.2 mg/dL (ref 0.1–0.5)
Indirect Bilirubin: 2.2 mg/dL — ABNORMAL HIGH (ref 0.3–0.9)
Total Bilirubin: 2.4 mg/dL — ABNORMAL HIGH (ref 0.3–1.2)
Total Protein: 6.4 g/dL — ABNORMAL LOW (ref 6.5–8.1)

## 2014-09-11 LAB — CREATININE, SERUM
Creatinine, Ser: 0.66 mg/dL (ref 0.61–1.24)
GFR calc Af Amer: >60 mL/min (ref >60)

## 2014-09-25 ENCOUNTER — Inpatient Hospital Stay: Payer: Medicare Other

## 2014-09-25 ENCOUNTER — Telehealth: Payer: Self-pay | Admitting: *Deleted

## 2014-09-25 DIAGNOSIS — D473 Essential (hemorrhagic) thrombocythemia: Secondary | ICD-10-CM

## 2014-09-25 LAB — CREATININE, SERUM
Creatinine, Ser: 0.74 mg/dL (ref 0.61–1.24)
GFR calc Af Amer: >60 mL/min
GFR calc non Af Amer: >60 mL/min

## 2014-09-25 LAB — CBC WITH DIFFERENTIAL/PLATELET
BASOS ABS: 0.2 10*3/uL — AB (ref 0–0.1)
Eosinophils Absolute: 0.1 10*3/uL (ref 0–0.7)
HEMATOCRIT: 40.3 % (ref 40.0–52.0)
Hemoglobin: 14.6 g/dL (ref 13.0–18.0)
LYMPHS ABS: 0.8 10*3/uL — AB (ref 1.0–3.6)
Lymphocytes Relative: 16 %
MCH: 40.3 pg — AB (ref 26.0–34.0)
MCHC: 36.2 g/dL — ABNORMAL HIGH (ref 32.0–36.0)
MCV: 111.4 fL — ABNORMAL HIGH (ref 80.0–100.0)
MONO ABS: 0.5 10*3/uL (ref 0.2–1.0)
Monocytes Relative: 11 %
NEUTROS ABS: 3.4 10*3/uL (ref 1.4–6.5)
Neutrophils Relative %: 67 %
PLATELETS: 421 10*3/uL (ref 150–440)
RBC: 3.61 MIL/uL — AB (ref 4.40–5.90)
RDW: 16.8 % — AB (ref 11.5–14.5)
WBC: 5 10*3/uL (ref 3.8–10.6)

## 2014-09-25 LAB — HEPATIC FUNCTION PANEL
ALBUMIN: 4.5 g/dL (ref 3.5–5.0)
ALT: 18 U/L (ref 17–63)
AST: 15 U/L (ref 15–41)
Alkaline Phosphatase: 42 U/L (ref 38–126)
BILIRUBIN INDIRECT: 2.7 mg/dL — AB (ref 0.3–0.9)
Bilirubin, Direct: 0.2 mg/dL (ref 0.1–0.5)
Total Bilirubin: 2.9 mg/dL — ABNORMAL HIGH (ref 0.3–1.2)
Total Protein: 6.5 g/dL (ref 6.5–8.1)

## 2014-09-25 LAB — POTASSIUM: Potassium: 3.5 mmol/L (ref 3.5–5.1)

## 2014-09-25 NOTE — Telephone Encounter (Signed)
Pt contacted and plt count 421.  NP covering for pandit today and stay on same dose which is 2 tablets every other day.

## 2014-10-09 ENCOUNTER — Inpatient Hospital Stay: Payer: Medicare Other | Attending: Internal Medicine

## 2014-10-09 DIAGNOSIS — D510 Vitamin B12 deficiency anemia due to intrinsic factor deficiency: Secondary | ICD-10-CM | POA: Insufficient documentation

## 2014-10-09 DIAGNOSIS — D473 Essential (hemorrhagic) thrombocythemia: Secondary | ICD-10-CM | POA: Insufficient documentation

## 2014-10-09 DIAGNOSIS — D539 Nutritional anemia, unspecified: Secondary | ICD-10-CM | POA: Insufficient documentation

## 2014-10-09 LAB — CBC WITH DIFFERENTIAL/PLATELET
Basophils Absolute: 0.1 10*3/uL (ref 0–0.1)
Eosinophils Absolute: 0.1 10*3/uL (ref 0–0.7)
Eosinophils Relative: 3 %
HCT: 40 % (ref 40.0–52.0)
Hemoglobin: 14.6 g/dL (ref 13.0–18.0)
Lymphocytes Relative: 16 %
Lymphs Abs: 0.8 10*3/uL — ABNORMAL LOW (ref 1.0–3.6)
MCH: 41.5 pg — ABNORMAL HIGH (ref 26.0–34.0)
MCHC: 36.4 g/dL — AB (ref 32.0–36.0)
MCV: 114 fL — AB (ref 80.0–100.0)
MONO ABS: 0.5 10*3/uL (ref 0.2–1.0)
Monocytes Relative: 10 %
NEUTROS ABS: 3.5 10*3/uL (ref 1.4–6.5)
Neutrophils Relative %: 70 %
Platelets: 382 10*3/uL (ref 150–440)
RBC: 3.51 MIL/uL — AB (ref 4.40–5.90)
RDW: 15.6 % — AB (ref 11.5–14.5)
WBC: 5.1 10*3/uL (ref 3.8–10.6)

## 2014-10-23 ENCOUNTER — Inpatient Hospital Stay: Payer: Medicare Other

## 2014-10-23 DIAGNOSIS — D473 Essential (hemorrhagic) thrombocythemia: Secondary | ICD-10-CM

## 2014-10-23 LAB — CBC WITH DIFFERENTIAL/PLATELET
Basophils Absolute: 0.1 10*3/uL (ref 0–0.1)
Eosinophils Absolute: 0.1 10*3/uL (ref 0–0.7)
Eosinophils Relative: 2 %
HEMATOCRIT: 39.4 % — AB (ref 40.0–52.0)
HEMOGLOBIN: 14.3 g/dL (ref 13.0–18.0)
Lymphs Abs: 0.9 10*3/uL — ABNORMAL LOW (ref 1.0–3.6)
MCH: 42.5 pg — ABNORMAL HIGH (ref 26.0–34.0)
MCHC: 36.3 g/dL — ABNORMAL HIGH (ref 32.0–36.0)
MCV: 117.1 fL — AB (ref 80.0–100.0)
MONO ABS: 0.5 10*3/uL (ref 0.2–1.0)
Monocytes Relative: 10 %
NEUTROS ABS: 3.5 10*3/uL (ref 1.4–6.5)
Platelets: 348 10*3/uL (ref 150–440)
RBC: 3.36 MIL/uL — ABNORMAL LOW (ref 4.40–5.90)
RDW: 15.7 % — AB (ref 11.5–14.5)
WBC: 5.1 10*3/uL (ref 3.8–10.6)

## 2014-11-06 ENCOUNTER — Inpatient Hospital Stay: Payer: Medicare Other | Attending: Internal Medicine

## 2014-11-06 DIAGNOSIS — D510 Vitamin B12 deficiency anemia due to intrinsic factor deficiency: Secondary | ICD-10-CM | POA: Diagnosis not present

## 2014-11-06 DIAGNOSIS — M545 Low back pain: Secondary | ICD-10-CM | POA: Insufficient documentation

## 2014-11-06 DIAGNOSIS — Z8547 Personal history of malignant neoplasm of testis: Secondary | ICD-10-CM | POA: Insufficient documentation

## 2014-11-06 DIAGNOSIS — K219 Gastro-esophageal reflux disease without esophagitis: Secondary | ICD-10-CM | POA: Insufficient documentation

## 2014-11-06 DIAGNOSIS — R600 Localized edema: Secondary | ICD-10-CM | POA: Diagnosis not present

## 2014-11-06 DIAGNOSIS — D473 Essential (hemorrhagic) thrombocythemia: Secondary | ICD-10-CM | POA: Diagnosis present

## 2014-11-06 DIAGNOSIS — D539 Nutritional anemia, unspecified: Secondary | ICD-10-CM | POA: Insufficient documentation

## 2014-11-06 DIAGNOSIS — Z79899 Other long term (current) drug therapy: Secondary | ICD-10-CM | POA: Insufficient documentation

## 2014-11-06 LAB — CBC WITH DIFFERENTIAL/PLATELET
Basophils Absolute: 0 10*3/uL (ref 0–0.1)
Eosinophils Absolute: 0.1 10*3/uL (ref 0–0.7)
HEMATOCRIT: 42.3 % (ref 40.0–52.0)
Hemoglobin: 15.1 g/dL (ref 13.0–18.0)
Lymphs Abs: 0.9 10*3/uL — ABNORMAL LOW (ref 1.0–3.6)
MCH: 42.1 pg — ABNORMAL HIGH (ref 26.0–34.0)
MCHC: 35.7 g/dL (ref 32.0–36.0)
MCV: 117.8 fL — AB (ref 80.0–100.0)
MONO ABS: 0.6 10*3/uL (ref 0.2–1.0)
Monocytes Relative: 14 %
NEUTROS ABS: 3.2 10*3/uL (ref 1.4–6.5)
Neutrophils Relative %: 66 %
PLATELETS: 447 10*3/uL — AB (ref 150–440)
RBC: 3.59 MIL/uL — ABNORMAL LOW (ref 4.40–5.90)
RDW: 15.1 % — AB (ref 11.5–14.5)
WBC: 4.8 10*3/uL (ref 3.8–10.6)

## 2014-11-20 ENCOUNTER — Inpatient Hospital Stay (HOSPITAL_BASED_OUTPATIENT_CLINIC_OR_DEPARTMENT_OTHER): Payer: Medicare Other | Admitting: Internal Medicine

## 2014-11-20 ENCOUNTER — Encounter: Payer: Self-pay | Admitting: Internal Medicine

## 2014-11-20 ENCOUNTER — Inpatient Hospital Stay: Payer: Medicare Other

## 2014-11-20 VITALS — BP 151/66 | HR 78 | Temp 97.2°F | Resp 18 | Ht 65.0 in | Wt 166.4 lb

## 2014-11-20 DIAGNOSIS — Z79899 Other long term (current) drug therapy: Secondary | ICD-10-CM

## 2014-11-20 DIAGNOSIS — R600 Localized edema: Secondary | ICD-10-CM | POA: Diagnosis not present

## 2014-11-20 DIAGNOSIS — D539 Nutritional anemia, unspecified: Secondary | ICD-10-CM

## 2014-11-20 DIAGNOSIS — K219 Gastro-esophageal reflux disease without esophagitis: Secondary | ICD-10-CM

## 2014-11-20 DIAGNOSIS — M545 Low back pain: Secondary | ICD-10-CM | POA: Diagnosis not present

## 2014-11-20 DIAGNOSIS — Z23 Encounter for immunization: Secondary | ICD-10-CM

## 2014-11-20 DIAGNOSIS — D510 Vitamin B12 deficiency anemia due to intrinsic factor deficiency: Secondary | ICD-10-CM

## 2014-11-20 DIAGNOSIS — Z8547 Personal history of malignant neoplasm of testis: Secondary | ICD-10-CM

## 2014-11-20 DIAGNOSIS — D473 Essential (hemorrhagic) thrombocythemia: Secondary | ICD-10-CM

## 2014-11-20 DIAGNOSIS — E538 Deficiency of other specified B group vitamins: Secondary | ICD-10-CM

## 2014-11-20 LAB — CBC WITH DIFFERENTIAL/PLATELET
Basophils Absolute: 0 10*3/uL (ref 0–0.1)
Basophils Relative: 0 %
Eosinophils Absolute: 0.1 10*3/uL (ref 0–0.7)
Eosinophils Relative: 2 %
HEMATOCRIT: 40.4 % (ref 40.0–52.0)
Hemoglobin: 14.6 g/dL (ref 13.0–18.0)
Lymphs Abs: 1 10*3/uL (ref 1.0–3.6)
MCH: 42.1 pg — ABNORMAL HIGH (ref 26.0–34.0)
MCHC: 36.1 g/dL — ABNORMAL HIGH (ref 32.0–36.0)
MCV: 116.6 fL — AB (ref 80.0–100.0)
Monocytes Absolute: 0.9 10*3/uL (ref 0.2–1.0)
Monocytes Relative: 13 %
Neutro Abs: 4.7 10*3/uL (ref 1.4–6.5)
Neutrophils Relative %: 70 %
PLATELETS: 441 10*3/uL — AB (ref 150–440)
RBC: 3.47 MIL/uL — AB (ref 4.40–5.90)
RDW: 14.6 % — ABNORMAL HIGH (ref 11.5–14.5)
WBC: 6.7 10*3/uL (ref 3.8–10.6)

## 2014-11-20 MED ORDER — INFLUENZA VAC SPLIT QUAD 0.5 ML IM SUSY: 0.5000 mL | PREFILLED_SYRINGE | Freq: Once | INTRAMUSCULAR | Status: DC

## 2014-11-20 NOTE — Progress Notes (Signed)
Drew Gardner  [phone] [phone]     ID: Drew Gardner OB: May 02, 1945  MR#: [id number]  JQZ#:[id number]  No care team member to display  CHIEF COMPLAINT/DIAGNOSIS:  1. Macrocytic Anemia, reticulocytosis -  workup showed B12 deficiency/pernicious anemia, otherwise unremarkable. Aug 2014 - [facility] Flow Cytometry study negative.    Workup done May 2012 -  Hb 13, plts 347K, WBC 7200 with 69% neutrophils, 1% myelocytes, 1% metamyelocytes, ab-retic 199. Serum B12 low at 201. Stool Hemoccult negative x2 Liver functions unremarkable except bili of 2.2. Iron study, ESR, LDH, haptoglobin, folate, SIEP, ANA, TSH, T4, direct Coombs' test all unremarkable. June 2012 -  serum Intrinsic factor antibody positive. November 2012 - serum LDH normal, direct Coombs negative.  Haptoglobin low at 33. May 2014 - ultrasound abdomen reported hepatic cysts, prominent spleen of 13.82 cm.  2. 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 10/04/13 - MRI lumbar spine done for low back pain reports extensive marrow signal abnormality worrisome for marrow disorder, along with disc disease. Bone marrow biopsy Aug 2015 myeloproliferative disorder.  Platelet count risen to 1028K on 07/01/14. Patient referred back here for hematology evaluation.              HISTORY OF PRESENT ILLNESS:  Patient returns for continued hematology followup, he was seen few months ago. He is on low-dose Hydrea. States he is tolerating this well and denies new side effects. No mouth sores. No diarrhea. No imbalance or dizziness. He is on oral B12. No new dyspnea or orthopnea. Appetite and weight are steady. Chronic back pain is same, denies other new bone pains. No fevers, chills or night sweats. Denies any bleeding symptoms. Denies h/o thromboembolic phenomena. States he gets swelling around ankles when his salt intake is more, but gets better at other times. States he remains  physically active.  REVIEW OF SYSTEMS:   ROS As in HPI above. In addition, no fevers. No new headaches or focal weakness.  No new mood disturbances. No sore throat or dysphagia. No chest pain, dizziness or palpitation. No abdominal pain, constipation, diarrhea, dysuria or hematuria. No new skin rash or bleeding symptoms. No new paresthesias in extremities.   PAST MEDICAL HISTORY: Past Medical History  Diagnosis Date  . Anemia   . GERD (gastroesophageal reflux disease)   . Glaucoma   . Testicular cancer     when patient ws 69 years old  . Dependent edema     chronic  . Essential thrombocytosis          Gastroesophageal reflux disease  Chronic dependent edema  History of testicular cancer at age 69 status post right orchiectomy and radiation  History of pernicious anemia currently not on B12 therapy  Glaucoma  Flow murmur  Reported h/o hemochromatosis (per patient, iron levels high in past, got phlebotomy by GI for months and was stopped)             Essential Thrombocytosis, CALR mutation positive                 PAST SURGICAL HISTORY: Past Surgical History  Procedure Laterality Date  . Orchiectomy Right     when he was 69 years old   FAMILY HISTORY: noncontributory, denies malignancy or hematological disorders  SOCIAL HISTORY: Social History  Substance Use Topics  . Smoking status: Never Smoker   . Smokeless tobacco: Never Used  . Alcohol Use: 0.6 oz/week    1 Cans of beer per  week     Comment: every once a while not an every week event  Denies history of smoking.  Drinks couple of beers on and off, no regular alcohol intake.  Physically active and ambulatory.  No Known Allergies  Current Outpatient Prescriptions  Medication Sig Dispense Refill  . hydrochlorothiazide (HYDRODIURIL) 25 MG tablet Take 25 mg by mouth daily.    . hydroxyurea (HYDREA) 500 MG capsule Take 500 mg by mouth 2 (two) times daily. Every other day.    . latanoprost (XALATAN) 0.005 % ophthalmic  solution Place 1 drop into both eyes 2 (two) times daily.    . pantoprazole (PROTONIX) 40 MG tablet Take 40 mg by mouth daily.    . Probiotic Product (PROBIOTIC DAILY PO) Take 1 Dose by mouth daily as needed.     . vitamin B-12 (CYANOCOBALAMIN) 1000 MCG tablet Take 1,000 mcg by mouth daily.     Current Facility-Administered Medications  Medication Dose Route Frequency Provider Last Rate Last Dose  . Influenza vac split quadrivalent PF (FLUARIX) injection 0.5 mL  0.5 mL Intramuscular Once Leia Alf, MD        OBJECTIVE: Filed Vitals:   11/20/14 0920  BP: 151/66  Pulse: 78  Temp: 97.2 F (36.2 C)  Resp: 18     Body mass index is 27.7 kg/(m^2).      GENERAL: patient is alert and oriented and in no acute distress. No icterus. HEENT: EOMs intact. No oral thrush or mucositis. cervical lymphadenopathy. CVS: S1S2, regular LUNGS: Bilaterally clear to auscultation, no rhonchi. ABDOMEN: Soft, nontender. No hepatosplenomegaly clinically.  NEURO: grossly nonfocal, cranial nerves are intact.   EXTREMITIES: mild pedal edema, no calf tenderness.   LAB RESULTS: WBC 6.7, Hb 14.6, Hct 40.4, platelets 441K, ANC 4.7. May 2016 - .iron sat 56%, serum iron 145, TIBC 259, ferritin 373, Cr 0.78, B12 894, LFT as below.    Component Value Date/Time   K 3.5 09/25/2014 0845   CREATININE 0.74 09/25/2014 0845   CREATININE 0.90 10/25/2012 1124   PROT 6.5 09/25/2014 0845   PROT 6.7 10/25/2012 1124   ALBUMIN 4.5 09/25/2014 0845   ALBUMIN 4.2 10/25/2012 1124   AST 15 09/25/2014 0845   AST 15 10/25/2012 1124   ALT 18 09/25/2014 0845   ALT 24 10/25/2012 1124   ALKPHOS 42 09/25/2014 0845   ALKPHOS 68 10/25/2012 1124   BILITOT 2.9* 09/25/2014 0845   BILITOT 2.5* 10/25/2012 1124   GFRNONAA >60 09/25/2014 0845   GFRNONAA >60 10/25/2012 1124   GFRAA >60 09/25/2014 0845   GFRAA >60 10/25/2012 1124  May 2016 - B12 normal at 894. 04/11/13 - serum B12 normal at 768.  10/04/13 - MRI lumbar spine.  IMPRESSION: Extensive marrow signal abnormality is worrisome for multiple myeloma or marrow infiltrative process. Moderate central canal narrowing with narrowing in the right lateral recess and foramen at L3-4 due to a disc bulge, ligamentum flavum thickening and a small synovial cyst off the right facet joint. Moderate central canal stenosis and marked narrowing in the left lateral recess at L4-5 due to disc bulging and facet degenerative change.  07/01/14 - Platelet count risen to 1028K, Hb 15.4, WBC 9700, 50% neutros, 24% lymphs, smudge cells present. Cr 0.7, K+ 5.7.   ASSESSMENT / PLAN:   1. Essential Thrombocytosis, CALR mutation positive. Started on Hydrea May 2016 - have reviewed labs and d/w patient. Platelet count is under good control, last 8 weeks has been in range of 421 - 441  today. He is tolerating Hydrea dose well, continue current dose and titrate dose of Hydrea to keep platelet count close to or within normal range. No h/o Thromboembolic phenomena. Will monitor CBC q4 weeks, monitor Cr and LFTs q12 weeks. Iron study, B12, folate level at 20 weeks. Next MD f/u at 24 weeks with labs and make continued treatment planning. Have recommended take ECASA 81 mg daily. Advised him to notify PMD if leg swelling worsens. 2. Pernicious Anemia with reticulocytosis, mild hyperbilirubinemia with negative hemolytic workup -  he continues to have persistent minor abnormalities including slightly elevated serum bilirubin (mostly indirect), mild reticulocytosis, prominent spleen on recent ultrasound of 13.8 cm, low haptoglobin in the past, raising possibility of unexplained low-grade hemolysis.  Recent direct Coombs test and LDH were unremarkable. [facility] flow cytometry done in Aug 2014 also negative. In February 2014 he had evidence of iron deficiency with low serum iron of 48 and low iron saturation of 17%. Currently on oral B12, recent serum B12 normal.  3. Prior MRI lumbar spine done for low back pain reports  extensive marrow signal abnormality worrisome for marrow disorder, along with disc disease - marrow signal abnormalities likely from essential thrombocytosis. 4. In between visits, patient advised to call in case of any new symptoms or acute sickness. He is agreeable to this plan.   Leia Alf, MD   11/20/2014 10:03 AM

## 2014-11-20 NOTE — Progress Notes (Signed)
Pt doing great from ET. Still has swelling of lower ext.

## 2014-12-15 ENCOUNTER — Other Ambulatory Visit: Payer: Self-pay | Admitting: Internal Medicine

## 2014-12-18 ENCOUNTER — Inpatient Hospital Stay: Payer: Medicare Other

## 2014-12-25 ENCOUNTER — Other Ambulatory Visit: Payer: Medicare Other

## 2015-01-01 ENCOUNTER — Inpatient Hospital Stay: Payer: Medicare Other | Attending: Internal Medicine

## 2015-01-01 DIAGNOSIS — D539 Nutritional anemia, unspecified: Secondary | ICD-10-CM | POA: Insufficient documentation

## 2015-01-01 DIAGNOSIS — D473 Essential (hemorrhagic) thrombocythemia: Secondary | ICD-10-CM | POA: Diagnosis not present

## 2015-01-01 DIAGNOSIS — D510 Vitamin B12 deficiency anemia due to intrinsic factor deficiency: Secondary | ICD-10-CM | POA: Diagnosis not present

## 2015-01-01 DIAGNOSIS — Z23 Encounter for immunization: Secondary | ICD-10-CM

## 2015-01-01 DIAGNOSIS — E538 Deficiency of other specified B group vitamins: Secondary | ICD-10-CM

## 2015-01-01 LAB — CBC WITH DIFFERENTIAL/PLATELET
Basophils Absolute: 0.1 10*3/uL (ref 0–0.1)
Basophils Relative: 2 %
EOS ABS: 0.1 10*3/uL (ref 0–0.7)
Eosinophils Relative: 2 %
HCT: 41.2 % (ref 40.0–52.0)
HEMOGLOBIN: 15.1 g/dL (ref 13.0–18.0)
Lymphs Abs: 1.2 10*3/uL (ref 1.0–3.6)
MCH: 42.6 pg — AB (ref 26.0–34.0)
MCHC: 36.7 g/dL — AB (ref 32.0–36.0)
MCV: 116.1 fL — ABNORMAL HIGH (ref 80.0–100.0)
Monocytes Absolute: 0.7 10*3/uL (ref 0.2–1.0)
NEUTROS ABS: 4.8 10*3/uL (ref 1.4–6.5)
PLATELETS: 447 10*3/uL — AB (ref 150–440)
RBC: 3.55 MIL/uL — ABNORMAL LOW (ref 4.40–5.90)
RDW: 14.8 % — ABNORMAL HIGH (ref 11.5–14.5)
WBC: 7 10*3/uL (ref 3.8–10.6)

## 2015-01-15 ENCOUNTER — Inpatient Hospital Stay: Payer: Medicare Other | Attending: Internal Medicine

## 2015-01-15 ENCOUNTER — Inpatient Hospital Stay: Payer: Medicare Other

## 2015-01-15 VITALS — BP 155/47 | HR 80 | Temp 96.4°F | Resp 19

## 2015-01-15 DIAGNOSIS — Z23 Encounter for immunization: Secondary | ICD-10-CM | POA: Diagnosis not present

## 2015-01-15 DIAGNOSIS — D510 Vitamin B12 deficiency anemia due to intrinsic factor deficiency: Secondary | ICD-10-CM | POA: Insufficient documentation

## 2015-01-15 DIAGNOSIS — D473 Essential (hemorrhagic) thrombocythemia: Secondary | ICD-10-CM

## 2015-01-15 DIAGNOSIS — D539 Nutritional anemia, unspecified: Secondary | ICD-10-CM | POA: Diagnosis not present

## 2015-01-15 DIAGNOSIS — E538 Deficiency of other specified B group vitamins: Secondary | ICD-10-CM

## 2015-01-15 LAB — CBC WITH DIFFERENTIAL/PLATELET
BASOS ABS: 0.1 10*3/uL (ref 0–0.1)
Basophils Relative: 2 %
EOS ABS: 0.1 10*3/uL (ref 0–0.7)
HCT: 40.8 % (ref 40.0–52.0)
Hemoglobin: 14.8 g/dL (ref 13.0–18.0)
LYMPHS ABS: 0.8 10*3/uL — AB (ref 1.0–3.6)
Lymphocytes Relative: 17 %
MCH: 42 pg — AB (ref 26.0–34.0)
MCHC: 36.2 g/dL — ABNORMAL HIGH (ref 32.0–36.0)
MCV: 116 fL — ABNORMAL HIGH (ref 80.0–100.0)
Monocytes Absolute: 0.5 10*3/uL (ref 0.2–1.0)
Monocytes Relative: 9 %
Neutro Abs: 3.6 10*3/uL (ref 1.4–6.5)
Neutrophils Relative %: 70 %
PLATELETS: 382 10*3/uL (ref 150–440)
RBC: 3.52 MIL/uL — AB (ref 4.40–5.90)
RDW: 14.8 % — AB (ref 11.5–14.5)
WBC: 5.1 10*3/uL (ref 3.8–10.6)

## 2015-01-15 MED ORDER — INFLUENZA VAC SPLIT QUAD 0.5 ML IM SUSY
0.5000 mL | PREFILLED_SYRINGE | Freq: Once | INTRAMUSCULAR | Status: AC
  Administered 2015-01-15: 0.5 mL via INTRAMUSCULAR

## 2015-01-26 ENCOUNTER — Other Ambulatory Visit: Payer: Self-pay | Admitting: Internal Medicine

## 2015-02-05 ENCOUNTER — Telehealth: Payer: Self-pay | Admitting: *Deleted

## 2015-02-05 NOTE — Telephone Encounter (Signed)
Pt came to office and he was completely out of his hydrea.  He takes 500 mg tablets and takes 1 on mon, wed, and Friday.  2 pills on tues, Thursday, sat. And sun.  And I spoke to Dr. Rudean Hitt and he gave me verbal order for 3 refills and I called it into his pharmacy [facility] in [place]. Pt was told in the office that i would call it in for him in the next 30 min.

## 2015-02-12 ENCOUNTER — Inpatient Hospital Stay: Payer: Medicare Other | Attending: Internal Medicine

## 2015-02-12 ENCOUNTER — Other Ambulatory Visit: Payer: Self-pay | Admitting: *Deleted

## 2015-02-12 DIAGNOSIS — D539 Nutritional anemia, unspecified: Secondary | ICD-10-CM | POA: Diagnosis not present

## 2015-02-12 DIAGNOSIS — E538 Deficiency of other specified B group vitamins: Secondary | ICD-10-CM

## 2015-02-12 DIAGNOSIS — D473 Essential (hemorrhagic) thrombocythemia: Secondary | ICD-10-CM | POA: Diagnosis present

## 2015-02-12 DIAGNOSIS — D510 Vitamin B12 deficiency anemia due to intrinsic factor deficiency: Secondary | ICD-10-CM | POA: Diagnosis not present

## 2015-02-12 LAB — CBC WITH DIFFERENTIAL/PLATELET
BASOS ABS: 0.2 10*3/uL — AB (ref 0–0.1)
Eosinophils Absolute: 0.1 10*3/uL (ref 0–0.7)
Eosinophils Relative: 1 %
HEMATOCRIT: 42.3 % (ref 40.0–52.0)
Hemoglobin: 15.3 g/dL (ref 13.0–18.0)
Lymphocytes Relative: 13 %
Lymphs Abs: 0.8 10*3/uL — ABNORMAL LOW (ref 1.0–3.6)
MCH: 41.3 pg — ABNORMAL HIGH (ref 26.0–34.0)
MCHC: 36 g/dL (ref 32.0–36.0)
MCV: 114.4 fL — ABNORMAL HIGH (ref 80.0–100.0)
MONO ABS: 0.5 10*3/uL (ref 0.2–1.0)
Monocytes Relative: 7 %
NEUTROS ABS: 4.9 10*3/uL (ref 1.4–6.5)
Neutrophils Relative %: 75 %
PLATELETS: 568 10*3/uL — AB (ref 150–440)
RBC: 3.7 MIL/uL — AB (ref 4.40–5.90)
RDW: 14.1 % (ref 11.5–14.5)
WBC: 6.5 10*3/uL (ref 3.8–10.6)

## 2015-02-12 LAB — CREATININE, SERUM: Creatinine, Ser: 0.8 mg/dL (ref 0.61–1.24)

## 2015-02-12 LAB — HEPATIC FUNCTION PANEL
ALT: 19 U/L (ref 17–63)
AST: 16 U/L (ref 15–41)
Albumin: 4.5 g/dL (ref 3.5–5.0)
Alkaline Phosphatase: 47 U/L (ref 38–126)
BILIRUBIN INDIRECT: 2.6 mg/dL — AB (ref 0.3–0.9)
Bilirubin, Direct: 0.2 mg/dL (ref 0.1–0.5)
TOTAL PROTEIN: 6.4 g/dL — AB (ref 6.5–8.1)
Total Bilirubin: 2.8 mg/dL — ABNORMAL HIGH (ref 0.3–1.2)

## 2015-03-12 ENCOUNTER — Inpatient Hospital Stay: Payer: Medicare Other | Attending: Internal Medicine

## 2015-03-12 ENCOUNTER — Telehealth: Payer: Self-pay | Admitting: *Deleted

## 2015-03-12 ENCOUNTER — Other Ambulatory Visit: Payer: Self-pay | Admitting: Internal Medicine

## 2015-03-12 DIAGNOSIS — D473 Essential (hemorrhagic) thrombocythemia: Secondary | ICD-10-CM | POA: Insufficient documentation

## 2015-03-12 DIAGNOSIS — D510 Vitamin B12 deficiency anemia due to intrinsic factor deficiency: Secondary | ICD-10-CM | POA: Diagnosis not present

## 2015-03-12 DIAGNOSIS — D539 Nutritional anemia, unspecified: Secondary | ICD-10-CM | POA: Insufficient documentation

## 2015-03-12 LAB — CBC WITH DIFFERENTIAL/PLATELET
Basophils Absolute: 0 10*3/uL (ref 0–0.1)
EOS ABS: 0.1 10*3/uL (ref 0–0.7)
Eosinophils Relative: 2 %
HCT: 42.6 % (ref 40.0–52.0)
HEMOGLOBIN: 15.5 g/dL (ref 13.0–18.0)
LYMPHS ABS: 1 10*3/uL (ref 1.0–3.6)
Lymphocytes Relative: 15 %
MCH: 42 pg — AB (ref 26.0–34.0)
MCHC: 36.3 g/dL — AB (ref 32.0–36.0)
MCV: 115.5 fL — ABNORMAL HIGH (ref 80.0–100.0)
MONO ABS: 0.7 10*3/uL (ref 0.2–1.0)
Neutro Abs: 4.6 10*3/uL (ref 1.4–6.5)
Neutrophils Relative %: 73 %
Platelets: 491 10*3/uL — ABNORMAL HIGH (ref 150–440)
RBC: 3.69 MIL/uL — ABNORMAL LOW (ref 4.40–5.90)
RDW: 15 % — AB (ref 11.5–14.5)
WBC: 6.3 10*3/uL (ref 3.8–10.6)

## 2015-03-12 MED ORDER — HYDROXYUREA 500 MG PO CAPS: ORAL_CAPSULE | ORAL | Status: DC

## 2015-03-12 NOTE — Telephone Encounter (Signed)
Pt contacted because plt 491 and with ET Dr. Rudean Hitt would like to have plt 400 or less.  His current regimen is hydrea 2 pills sun, tues, thurs, Sat. And 1 pill Mon, wed, and Friday.  Would like him to take 2 pills sun, tues, wed, thur. Sat. And 1 pill on on and Friday. Dr already sent new directions with rx into his pharmacy. Pt agreeable and he already has appt next month as lab only to decide if any changes needs to be made.

## 2015-04-09 ENCOUNTER — Inpatient Hospital Stay: Payer: Medicare Other | Attending: Internal Medicine

## 2015-04-09 ENCOUNTER — Other Ambulatory Visit: Payer: Self-pay | Admitting: Internal Medicine

## 2015-04-09 DIAGNOSIS — D473 Essential (hemorrhagic) thrombocythemia: Secondary | ICD-10-CM

## 2015-04-09 DIAGNOSIS — D510 Vitamin B12 deficiency anemia due to intrinsic factor deficiency: Secondary | ICD-10-CM | POA: Diagnosis not present

## 2015-04-09 DIAGNOSIS — E538 Deficiency of other specified B group vitamins: Secondary | ICD-10-CM

## 2015-04-09 DIAGNOSIS — D539 Nutritional anemia, unspecified: Secondary | ICD-10-CM

## 2015-04-09 LAB — CBC WITH DIFFERENTIAL/PLATELET
Basophils Absolute: 0.1 10*3/uL (ref 0–0.1)
Eosinophils Absolute: 0.1 10*3/uL (ref 0–0.7)
Eosinophils Relative: 1 %
HEMATOCRIT: 42.9 % (ref 40.0–52.0)
HEMOGLOBIN: 15.3 g/dL (ref 13.0–18.0)
LYMPHS ABS: 1.1 10*3/uL (ref 1.0–3.6)
Lymphocytes Relative: 19 %
MCH: 42.6 pg — ABNORMAL HIGH (ref 26.0–34.0)
MCHC: 35.7 g/dL (ref 32.0–36.0)
MCV: 119.4 fL — ABNORMAL HIGH (ref 80.0–100.0)
Monocytes Absolute: 0.6 10*3/uL (ref 0.2–1.0)
NEUTROS ABS: 3.9 10*3/uL (ref 1.4–6.5)
Platelets: 347 10*3/uL (ref 150–440)
RBC: 3.59 MIL/uL — ABNORMAL LOW (ref 4.40–5.90)
RDW: 15.9 % — ABNORMAL HIGH (ref 11.5–14.5)
WBC: 5.8 10*3/uL (ref 3.8–10.6)

## 2015-04-09 LAB — IRON AND TIBC
IRON: 147 ug/dL (ref 45–182)
Saturation Ratios: 50 % — ABNORMAL HIGH (ref 17.9–39.5)
TIBC: 296 ug/dL (ref 250–450)
UIBC: 149 ug/dL

## 2015-04-09 LAB — FERRITIN: Ferritin: 313 ng/mL (ref 24–336)

## 2015-04-09 LAB — VITAMIN B12: Vitamin B-12: 801 pg/mL (ref 180–914)

## 2015-04-09 LAB — FOLATE: FOLATE: 10.3 ng/mL (ref >5.9)

## 2015-04-09 MED ORDER — HYDROXYUREA 500 MG PO CAPS: ORAL_CAPSULE | ORAL | Status: DC

## 2015-04-30 ENCOUNTER — Telehealth: Payer: Self-pay | Admitting: *Deleted

## 2015-04-30 NOTE — Telephone Encounter (Signed)
Called in refill of pt hydrea. He takes 2 capsules on sun, tues, thurs and sat.  1 tablet on mon and wed and fri.  i confirmed that it is his current dose and he verbalized it is.  Called into refill and the pharmacist reads me the instructions from Jan wwhen i called it in and it is 2 capsules on tues, wed, thurs, and Sat, Sun.  1 capsule on mon and fri.  I told him to fill that rx from [facility] after I confirmed with Dr. Rudean Hitt about it and that is what he wanted.  I called pt and told him that his bottle bhe brought in was different than what i told him in [place].  He says he is sorry but he has been taking it 1 capsule on mon and fri and all other days 2 capsules which matches what Berenzon said in [place].  The pharmacy is filling the rx I sent in Jan and it has refills and he is meeting with new md on March Brah[place]ay.

## 2015-05-07 ENCOUNTER — Other Ambulatory Visit: Payer: Medicare Other

## 2015-05-07 ENCOUNTER — Ambulatory Visit: Payer: Medicare Other

## 2015-05-12 ENCOUNTER — Inpatient Hospital Stay: Payer: Medicare Other

## 2015-05-12 ENCOUNTER — Inpatient Hospital Stay: Payer: Medicare Other | Admitting: Internal Medicine

## 2015-05-14 ENCOUNTER — Other Ambulatory Visit: Payer: Self-pay | Admitting: Internal Medicine

## 2015-05-19 ENCOUNTER — Encounter: Payer: Self-pay | Admitting: Internal Medicine

## 2015-05-19 ENCOUNTER — Inpatient Hospital Stay (HOSPITAL_BASED_OUTPATIENT_CLINIC_OR_DEPARTMENT_OTHER): Payer: Medicare Other | Admitting: Internal Medicine

## 2015-05-19 ENCOUNTER — Inpatient Hospital Stay: Payer: Medicare Other | Attending: Internal Medicine

## 2015-05-19 VITALS — BP 147/60 | HR 79 | Temp 96.2°F | Resp 18 | Ht 65.0 in | Wt 173.9 lb

## 2015-05-19 DIAGNOSIS — Z8547 Personal history of malignant neoplasm of testis: Secondary | ICD-10-CM | POA: Diagnosis not present

## 2015-05-19 DIAGNOSIS — R600 Localized edema: Secondary | ICD-10-CM | POA: Insufficient documentation

## 2015-05-19 DIAGNOSIS — D473 Essential (hemorrhagic) thrombocythemia: Secondary | ICD-10-CM | POA: Diagnosis present

## 2015-05-19 DIAGNOSIS — Z9079 Acquired absence of other genital organ(s): Secondary | ICD-10-CM | POA: Diagnosis not present

## 2015-05-19 DIAGNOSIS — R531 Weakness: Secondary | ICD-10-CM | POA: Diagnosis not present

## 2015-05-19 DIAGNOSIS — Z79899 Other long term (current) drug therapy: Secondary | ICD-10-CM | POA: Insufficient documentation

## 2015-05-19 DIAGNOSIS — K219 Gastro-esophageal reflux disease without esophagitis: Secondary | ICD-10-CM | POA: Diagnosis not present

## 2015-05-19 DIAGNOSIS — Z7982 Long term (current) use of aspirin: Secondary | ICD-10-CM | POA: Insufficient documentation

## 2015-05-19 DIAGNOSIS — D510 Vitamin B12 deficiency anemia due to intrinsic factor deficiency: Secondary | ICD-10-CM | POA: Insufficient documentation

## 2015-05-19 LAB — CBC WITH DIFFERENTIAL/PLATELET
BASOS ABS: 0 10*3/uL (ref 0–0.1)
BASOS PCT: 1 %
EOS ABS: 0 10*3/uL (ref 0–0.7)
EOS PCT: 1 %
HEMATOCRIT: 41.2 % (ref 40.0–52.0)
Hemoglobin: 14.7 g/dL (ref 13.0–18.0)
Lymphocytes Relative: 18 %
Lymphs Abs: 0.8 10*3/uL — ABNORMAL LOW (ref 1.0–3.6)
MCH: 44.3 pg — ABNORMAL HIGH (ref 26.0–34.0)
MCHC: 35.7 g/dL (ref 32.0–36.0)
MCV: 124 fL — ABNORMAL HIGH (ref 80.0–100.0)
MONO ABS: 0.5 10*3/uL (ref 0.2–1.0)
MONOS PCT: 11 %
Neutro Abs: 3.1 10*3/uL (ref 1.4–6.5)
Neutrophils Relative %: 69 %
PLATELETS: 276 10*3/uL (ref 150–440)
RBC: 3.32 MIL/uL — ABNORMAL LOW (ref 4.40–5.90)
RDW: 14.5 % (ref 11.5–14.5)
WBC: 4.5 10*3/uL (ref 3.8–10.6)

## 2015-05-19 NOTE — Progress Notes (Signed)
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 I reviewed the patient's prior charts/pertinent labs/imaging in detail; findings are summarized above.   A very pleasant 70 year old male patient with above history of anemia/B12 deficiency; and also history of essential thrombocytosis currently on Hydrea is here for follow-up.  Patient complains of mild fatigue in the last few weeks. Otherwise denies any nausea vomiting. Denies any sores in the mouth. Denies any skin rash. Denies any history of strokes or blood clots.   REVIEW OF SYSTEMS:  A complete 10 point review of system is done which is negative except mentioned above/history of present illness.   PAST MEDICAL HISTORY :  Past Medical History  Diagnosis Date  . Anemia   . GERD (gastroesophageal reflux disease)   . Glaucoma   . Testicular cancer [facility])     when patient ws 70 years old  . Dependent edema     chronic  . Essential thrombocytosis ([place])     PAST SURGICAL HISTORY :   Past Surgical History  Procedure Laterality Date  . Orchiectomy Right     when he was 70 years old  . Cholecystectomy      FAMILY HISTORY :   Family History  Problem Relation Age of Onset  . Stroke Mother   . Stroke Father     SOCIAL HISTORY:   Social History  Substance Use Topics  . Smoking status:  Never Smoker   . Smokeless tobacco: Never Used  . Alcohol Use: 0.6 oz/week    1 Cans of beer per week     Comment: every once a while not an every week event    ALLERGIES:  has No Known Allergies.  MEDICATIONS:  Current Outpatient Prescriptions  Medication Sig Dispense Refill  . aspirin 81 MG tablet Take 81 mg by mouth daily.    . hydrochlorothiazide (HYDRODIURIL) 25 MG tablet Take 25 mg by mouth daily.    . hydroxyurea (HYDREA) 500 MG capsule Take 1 capsule a day Monday and Friday, 2 capsules a day the rest of the week. 60 capsule 3  . latanoprost (XALATAN) 0.005 % ophthalmic solution Place 1 drop into both eyes 2 (two) times daily.    . pantoprazole (PROTONIX) 40 MG tablet Take 40 mg by mouth daily.    . Probiotic Product (PROBIOTIC DAILY PO) Take 1 Dose by mouth daily as needed.     . vitamin B-12 (CYANOCOBALAMIN) 1000 MCG tablet Take 1,000 mcg by mouth daily.     No current facility-administered medications for this visit.    PHYSICAL EXAMINATION:   BP 147/60 mmHg  Pulse 79  Temp(Src) 96.2 F (35.7 C) (Tympanic)  Resp 18  Ht 5\' 5"  (1.651 m)  Wt 173 lb 15.1 oz (78.9 kg)  BMI 28.95 kg/m2  Filed Weights   05/19/15 0834  Weight: 173 lb 15.1 oz (78.9 kg)    GENERAL: Well-nourished well-developed; Alert, no distress  and comfortable.   EYES: no pallor or icterus OROPHARYNX: no thrush or ulceration; good dentition  NECK: supple, no masses felt LYMPH:  no palpable lymphadenopathy in the cervical, axillary or inguinal regions LUNGS: clear to auscultation and  No wheeze or crackles HEART/CVS: regular rate & rhythm and no murmurs; No lower extremity edema ABDOMEN:abdomen soft, non-tender and normal bowel sounds Musculoskeletal:no cyanosis of digits and no clubbing  PSYCH: alert & oriented x 3 with fluent speech NEURO: no focal motor/sensory deficits SKIN:  no rashes or significant lesions  LABORATORY DATA:  I have reviewed the data as listed    Component Value  Date/Time   K 3.5 09/25/2014 0845   CREATININE 0.80 02/12/2015 0843   CREATININE 0.90 10/25/2012 1124   PROT 6.4* 02/12/2015 0843   PROT 6.7 10/25/2012 1124   ALBUMIN 4.5 02/12/2015 0843   ALBUMIN 4.2 10/25/2012 1124   AST 16 02/12/2015 0843   AST 15 10/25/2012 1124   ALT 19 02/12/2015 0843   ALT 24 10/25/2012 1124   ALKPHOS 47 02/12/2015 0843   ALKPHOS 68 10/25/2012 1124   BILITOT 2.8* 02/12/2015 0843   BILITOT 2.5* 10/25/2012 1124   GFRNONAA >60 02/12/2015 0843   GFRNONAA >60 10/25/2012 1124   GFRAA >60 02/12/2015 0843   GFRAA >60 10/25/2012 1124    No results found for: SPEP, UPEP  Lab Results  Component Value Date   WBC 4.5 05/19/2015   NEUTROABS 3.1 05/19/2015   HGB 14.7 05/19/2015   HCT 41.2 05/19/2015   MCV 124.0* 05/19/2015   PLT 276 05/19/2015      Chemistry      Component Value Date/Time   K 3.5 09/25/2014 0845   CREATININE 0.80 02/12/2015 0843   CREATININE 0.90 10/25/2012 1124      Component Value Date/Time   ALKPHOS 47 02/12/2015 0843   ALKPHOS 68 10/25/2012 1124   AST 16 02/12/2015 0843   AST 15 10/25/2012 1124   ALT 19 02/12/2015 0843   ALT 24 10/25/2012 1124   BILITOT 2.8* 02/12/2015 0843   BILITOT 2.5* 10/25/2012 1124          ASSESSMENT & PLAN:   # ESSENTIAL THROMBOCYTOSIS- Hydrea thousand milligrams once a day except Monday Friday 500 mg a day. Patient's platelets are within normal limits to 272. Patient is on aspirin.  # Pernicious anemia/B12 deficiency on B12 supplementation. Most recent B12 approximately 1 month ago was within normal limits.  # Patient follow-up with labs in 2 months; follow-up with me labs in 4 months.  # 15 minutes face-to-face with the patient discussing the above plan of care; more than 50% of time spent on natural history; counseling and coordination     Cammie Sickle, MD 05/19/2015 8:55 AM

## 2015-05-19 NOTE — Progress Notes (Signed)
Pt here for ET, doing well on hydrea. Eating and drinking good. Feels some tired and taking extra b12 last couple of days to help with energy. His bowels are like IBS but it is the normal for him

## 2015-07-21 ENCOUNTER — Inpatient Hospital Stay: Payer: Medicare Other | Attending: Internal Medicine

## 2015-07-21 DIAGNOSIS — D473 Essential (hemorrhagic) thrombocythemia: Secondary | ICD-10-CM | POA: Insufficient documentation

## 2015-07-21 DIAGNOSIS — D510 Vitamin B12 deficiency anemia due to intrinsic factor deficiency: Secondary | ICD-10-CM | POA: Insufficient documentation

## 2015-07-22 ENCOUNTER — Inpatient Hospital Stay: Payer: Medicare Other

## 2015-07-22 DIAGNOSIS — D473 Essential (hemorrhagic) thrombocythemia: Secondary | ICD-10-CM

## 2015-07-22 DIAGNOSIS — D510 Vitamin B12 deficiency anemia due to intrinsic factor deficiency: Secondary | ICD-10-CM | POA: Diagnosis not present

## 2015-07-22 LAB — CBC WITH DIFFERENTIAL/PLATELET
Basophils Absolute: 0 10*3/uL (ref 0–0.1)
Basophils Relative: 0 %
EOS PCT: 2 %
Eosinophils Absolute: 0.1 10*3/uL (ref 0–0.7)
HEMATOCRIT: 38.2 % — AB (ref 40.0–52.0)
Hemoglobin: 13.7 g/dL (ref 13.0–18.0)
LYMPHS ABS: 0.8 10*3/uL — AB (ref 1.0–3.6)
LYMPHS PCT: 15 %
MCH: 43.2 pg — AB (ref 26.0–34.0)
MCHC: 35.9 g/dL (ref 32.0–36.0)
MCV: 120.3 fL — AB (ref 80.0–100.0)
MONO ABS: 0.6 10*3/uL (ref 0.2–1.0)
Monocytes Relative: 12 %
NEUTROS ABS: 3.8 10*3/uL (ref 1.4–6.5)
Neutrophils Relative %: 71 %
PLATELETS: 286 10*3/uL (ref 150–440)
RBC: 3.18 MIL/uL — AB (ref 4.40–5.90)
RDW: 14.6 % — ABNORMAL HIGH (ref 11.5–14.5)
WBC: 5.3 10*3/uL (ref 3.8–10.6)

## 2015-07-22 LAB — HEPATIC FUNCTION PANEL
ALT: 21 U/L (ref 17–63)
AST: 17 U/L (ref 15–41)
Albumin: 4.4 g/dL (ref 3.5–5.0)
Alkaline Phosphatase: 45 U/L (ref 38–126)
BILIRUBIN DIRECT: 0.2 mg/dL (ref 0.1–0.5)
BILIRUBIN INDIRECT: 2.3 mg/dL — AB (ref 0.3–0.9)
BILIRUBIN TOTAL: 2.5 mg/dL — AB (ref 0.3–1.2)
Total Protein: 6.4 g/dL — ABNORMAL LOW (ref 6.5–8.1)

## 2015-07-22 LAB — CREATININE, SERUM
Creatinine, Ser: 0.71 mg/dL (ref 0.61–1.24)
GFR calc non Af Amer: >60 mL/min (ref >60)

## 2015-08-27 NOTE — Progress Notes (Signed)
This encounter was created in error - please disregard.

## 2015-09-22 ENCOUNTER — Inpatient Hospital Stay: Payer: Medicare Other | Attending: Internal Medicine

## 2015-09-22 ENCOUNTER — Inpatient Hospital Stay (HOSPITAL_BASED_OUTPATIENT_CLINIC_OR_DEPARTMENT_OTHER): Payer: Medicare Other | Admitting: Internal Medicine

## 2015-09-22 VITALS — BP 149/70 | HR 78 | Temp 97.7°F | Resp 18 | Wt 169.8 lb

## 2015-09-22 DIAGNOSIS — K219 Gastro-esophageal reflux disease without esophagitis: Secondary | ICD-10-CM | POA: Insufficient documentation

## 2015-09-22 DIAGNOSIS — Z8547 Personal history of malignant neoplasm of testis: Secondary | ICD-10-CM | POA: Insufficient documentation

## 2015-09-22 DIAGNOSIS — D539 Nutritional anemia, unspecified: Secondary | ICD-10-CM | POA: Insufficient documentation

## 2015-09-22 DIAGNOSIS — D473 Essential (hemorrhagic) thrombocythemia: Secondary | ICD-10-CM

## 2015-09-22 DIAGNOSIS — D510 Vitamin B12 deficiency anemia due to intrinsic factor deficiency: Secondary | ICD-10-CM | POA: Diagnosis not present

## 2015-09-22 DIAGNOSIS — R42 Dizziness and giddiness: Secondary | ICD-10-CM | POA: Diagnosis not present

## 2015-09-22 DIAGNOSIS — Z7982 Long term (current) use of aspirin: Secondary | ICD-10-CM | POA: Diagnosis not present

## 2015-09-22 DIAGNOSIS — Z79899 Other long term (current) drug therapy: Secondary | ICD-10-CM | POA: Insufficient documentation

## 2015-09-22 DIAGNOSIS — R5383 Other fatigue: Secondary | ICD-10-CM | POA: Diagnosis not present

## 2015-09-22 LAB — HEPATIC FUNCTION PANEL
ALBUMIN: 4.2 g/dL (ref 3.5–5.0)
ALT: 23 U/L (ref 17–63)
AST: 14 U/L — ABNORMAL LOW (ref 15–41)
Alkaline Phosphatase: 43 U/L (ref 38–126)
BILIRUBIN DIRECT: 0.2 mg/dL (ref 0.1–0.5)
BILIRUBIN INDIRECT: 3.3 mg/dL — AB (ref 0.3–0.9)
BILIRUBIN TOTAL: 3.5 mg/dL — AB (ref 0.3–1.2)
Total Protein: 6.3 g/dL — ABNORMAL LOW (ref 6.5–8.1)

## 2015-09-22 LAB — CBC WITH DIFFERENTIAL/PLATELET
BASOS ABS: 0 10*3/uL (ref 0–0.1)
EOS ABS: 0.1 10*3/uL (ref 0–0.7)
HCT: 38.8 % — ABNORMAL LOW (ref 40.0–52.0)
Hemoglobin: 14 g/dL (ref 13.0–18.0)
Lymphocytes Relative: 19 %
Lymphs Abs: 0.8 10*3/uL — ABNORMAL LOW (ref 1.0–3.6)
MCH: 42.9 pg — ABNORMAL HIGH (ref 26.0–34.0)
MCHC: 36.2 g/dL — AB (ref 32.0–36.0)
MCV: 118.4 fL — ABNORMAL HIGH (ref 80.0–100.0)
MONO ABS: 0.5 10*3/uL (ref 0.2–1.0)
Neutro Abs: 2.7 10*3/uL (ref 1.4–6.5)
Neutrophils Relative %: 65 %
PLATELETS: 249 10*3/uL (ref 150–440)
RBC: 3.27 MIL/uL — ABNORMAL LOW (ref 4.40–5.90)
RDW: 15.4 % — AB (ref 11.5–14.5)
WBC: 4.1 10*3/uL (ref 3.8–10.6)

## 2015-09-22 LAB — CREATININE, SERUM
Creatinine, Ser: 0.75 mg/dL (ref 0.61–1.24)
GFR calc Af Amer: >60 mL/min (ref >60)

## 2015-09-22 MED ORDER — HYDROXYUREA 500 MG PO CAPS: ORAL_CAPSULE | ORAL | Status: DC

## 2015-09-22 NOTE — Progress Notes (Signed)
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   He has been recently diagnosed with vertigo on meclizine; noted to be drowsy/fatigue. He is planning to wean himself off the meclizine Otherwise denies any nausea vomiting. Denies any sores in the mouth. Denies any skin rash. Denies any history of strokes or blood clots.   REVIEW OF SYSTEMS:  A complete 10 point review of system is done which is negative except mentioned above/history of present illness.   PAST MEDICAL HISTORY :  Past Medical History  Diagnosis Date  . Anemia   . GERD (gastroesophageal reflux disease)   . Glaucoma   . Testicular cancer [facility])     when patient ws 70 years old  . Dependent edema     chronic  . Essential thrombocytosis ([place])     PAST SURGICAL HISTORY :   Past Surgical History  Procedure Laterality Date  . Orchiectomy Right     when he was 70 years old  . Cholecystectomy      FAMILY HISTORY :   Family History  Problem Relation Age of Onset  . Stroke Mother   . Stroke Father     SOCIAL HISTORY:   Social History  Substance Use Topics  . Smoking status: Never Smoker   . Smokeless tobacco: Never Used  . Alcohol Use: 0.6 oz/week    1 Cans of beer  per week     Comment: every once a while not an every week event    ALLERGIES:  has No Known Allergies.  MEDICATIONS:  Current Outpatient Prescriptions  Medication Sig Dispense Refill  . aspirin 81 MG tablet Take 81 mg by mouth daily.    . hydrochlorothiazide (HYDRODIURIL) 25 MG tablet Take 25 mg by mouth daily.    . hydroxyurea (HYDREA) 500 MG capsule Take 1 capsule a day Monday and Friday, 2 capsules a day the rest of the week. 60 capsule 3  . latanoprost (XALATAN) 0.005 % ophthalmic solution Place 1 drop into both eyes 2 (two) times daily.    . pantoprazole (PROTONIX) 40 MG tablet Take 40 mg by mouth daily.    . Probiotic Product (PROBIOTIC DAILY PO) Take 1 Dose by mouth daily as needed.     . vitamin B-12 (CYANOCOBALAMIN) 1000 MCG tablet Take 1,000 mcg by mouth daily.     No current facility-administered medications for this visit.    PHYSICAL EXAMINATION:   BP 149/70 mmHg  Pulse 78  Temp(Src) 97.7 F (36.5 C) (Tympanic)  Resp 18  Wt 169 lb 12.1 oz (77 kg)  Filed Weights   09/22/15 0917  Weight: 169 lb 12.1 oz (77 kg)    GENERAL: Well-nourished well-developed; Alert, no distress and comfortable.   EYES: no pallor or icterus OROPHARYNX: no thrush or ulceration; good dentition  NECK: supple, no masses felt LYMPH:  no palpable lymphadenopathy in the cervical, axillary or inguinal regions LUNGS: clear to auscultation and  No wheeze or crackles HEART/CVS: regular rate & rhythm and no murmurs; 1 plus Bil LE edema ABDOMEN:abdomen soft, non-tender and normal bowel sounds Musculoskeletal:no cyanosis of digits and no clubbing  PSYCH: alert & oriented x 3 with fluent speech NEURO: no focal motor/sensory deficits SKIN:  no rashes or significant lesions  LABORATORY DATA:  I have reviewed the data as listed    Component Value Date/Time   K 3.5 09/25/2014 0845   CREATININE 0.75 09/22/2015 0847   CREATININE 0.90 10/25/2012 1124   PROT 6.3* 09/22/2015 0847   PROT 6.7  10/25/2012 1124   ALBUMIN 4.2 09/22/2015 0847   ALBUMIN 4.2 10/25/2012 1124   AST 14* 09/22/2015 0847   AST 15 10/25/2012 1124   ALT 23 09/22/2015 0847   ALT 24 10/25/2012 1124   ALKPHOS 43 09/22/2015 0847   ALKPHOS 68 10/25/2012 1124   BILITOT 3.5* 09/22/2015 0847   BILITOT 2.5* 10/25/2012 1124   GFRNONAA >60 09/22/2015 0847   GFRNONAA >60 10/25/2012 1124   GFRAA >60 09/22/2015 0847   GFRAA >60 10/25/2012 1124    No results found for: SPEP, UPEP  Lab Results  Component Value Date   WBC 4.1 09/22/2015   NEUTROABS 2.7 09/22/2015   HGB 14.0 09/22/2015   HCT 38.8* 09/22/2015   MCV 118.4* 09/22/2015   PLT 249 09/22/2015      Chemistry      Component Value Date/Time   K 3.5 09/25/2014 0845   CREATININE 0.75 09/22/2015 0847   CREATININE 0.90 10/25/2012 1124      Component Value Date/Time   ALKPHOS 43 09/22/2015 0847   ALKPHOS 68 10/25/2012 1124   AST 14* 09/22/2015 0847   AST 15 10/25/2012 1124   ALT 23 09/22/2015 0847   ALT 24 10/25/2012 1124   BILITOT 3.5* 09/22/2015 0847   BILITOT 2.5* 10/25/2012 1124          ASSESSMENT & PLAN:   Essential thrombocythemia ([place]) # ESSENTIAL THROMBOCYTOSIS- Hydrea thousand milligrams once a day except Monday Friday 500 mg a day. Patient's platelets are within normal limits. Patient is on aspirin. He was given a refill on his Hydrea today.  # Pernicious anemia/B12 deficiency on B12 supplementation.   # Patient follow-up with labs in 3 months; follow-up with me labs in 6 months.  # 15 minutes face-to-face with the patient discussing the above plan of care; more than 50% of time spent on natural history; counseling and coordination     Cammie Sickle, MD 09/22/2015 10:42 AM

## 2015-09-22 NOTE — Assessment & Plan Note (Signed)
#   ESSENTIAL THROMBOCYTOSIS- Hydrea thousand milligrams once a day except Monday Friday 500 mg a day. Patient's platelets are within normal limits. Patient is on aspirin. He was given a refill on his Hydrea today.  # Pernicious anemia/B12 deficiency on B12 supplementation.   # Patient follow-up with labs in 3 months; follow-up with me labs in 6 months.  # 15 minutes face-to-face with the patient discussing the above plan of care; more than 50% of time spent on natural history; counseling and coordination

## 2015-12-22 ENCOUNTER — Inpatient Hospital Stay: Payer: Medicare Other

## 2015-12-23 ENCOUNTER — Inpatient Hospital Stay: Payer: Medicare Other | Attending: Internal Medicine

## 2015-12-23 DIAGNOSIS — D539 Nutritional anemia, unspecified: Secondary | ICD-10-CM | POA: Insufficient documentation

## 2015-12-23 DIAGNOSIS — D510 Vitamin B12 deficiency anemia due to intrinsic factor deficiency: Secondary | ICD-10-CM | POA: Insufficient documentation

## 2015-12-23 DIAGNOSIS — D473 Essential (hemorrhagic) thrombocythemia: Secondary | ICD-10-CM | POA: Diagnosis present

## 2015-12-23 LAB — CBC WITH DIFFERENTIAL/PLATELET
Basophils Absolute: 0 10*3/uL (ref 0–0.1)
Basophils Relative: 0 %
EOS PCT: 2 %
Eosinophils Absolute: 0.1 10*3/uL (ref 0–0.7)
HEMATOCRIT: 40.9 % (ref 40.0–52.0)
Hemoglobin: 14.8 g/dL (ref 13.0–18.0)
LYMPHS ABS: 1 10*3/uL (ref 1.0–3.6)
LYMPHS PCT: 18 %
MCH: 43.4 pg — AB (ref 26.0–34.0)
MCHC: 36.1 g/dL — ABNORMAL HIGH (ref 32.0–36.0)
MCV: 120.1 fL — AB (ref 80.0–100.0)
MONO ABS: 0.7 10*3/uL (ref 0.2–1.0)
Monocytes Relative: 13 %
NEUTROS ABS: 3.7 10*3/uL (ref 1.4–6.5)
Neutrophils Relative %: 67 %
PLATELETS: 348 10*3/uL (ref 150–440)
RBC: 3.41 MIL/uL — AB (ref 4.40–5.90)
RDW: 14.9 % — AB (ref 11.5–14.5)
WBC: 5.5 10*3/uL (ref 3.8–10.6)

## 2015-12-23 LAB — COMPREHENSIVE METABOLIC PANEL
ALT: 24 U/L (ref 17–63)
AST: 19 U/L (ref 15–41)
Albumin: 4.7 g/dL (ref 3.5–5.0)
Alkaline Phosphatase: 41 U/L (ref 38–126)
Anion gap: 8 (ref 5–15)
BILIRUBIN TOTAL: 3.2 mg/dL — AB (ref 0.3–1.2)
BUN: 19 mg/dL (ref 6–20)
CHLORIDE: 103 mmol/L (ref 101–111)
CO2: 25 mmol/L (ref 22–32)
CREATININE: 0.86 mg/dL (ref 0.61–1.24)
Calcium: 9.3 mg/dL (ref 8.9–10.3)
Glucose, Bld: 106 mg/dL — ABNORMAL HIGH (ref 65–99)
Potassium: 3.8 mmol/L (ref 3.5–5.1)
Sodium: 136 mmol/L (ref 135–145)
TOTAL PROTEIN: 6.5 g/dL (ref 6.5–8.1)

## 2016-03-22 ENCOUNTER — Inpatient Hospital Stay: Payer: Medicare Other

## 2016-03-22 ENCOUNTER — Inpatient Hospital Stay: Payer: Medicare Other | Admitting: Internal Medicine

## 2016-04-12 ENCOUNTER — Inpatient Hospital Stay: Payer: Medicare Other | Attending: Internal Medicine | Admitting: Internal Medicine

## 2016-04-12 ENCOUNTER — Inpatient Hospital Stay: Payer: Medicare Other

## 2016-04-12 VITALS — BP 145/63 | HR 82 | Resp 20 | Ht 65.0 in | Wt 172.0 lb

## 2016-04-12 DIAGNOSIS — I890 Lymphedema, not elsewhere classified: Secondary | ICD-10-CM | POA: Diagnosis not present

## 2016-04-12 DIAGNOSIS — K219 Gastro-esophageal reflux disease without esophagitis: Secondary | ICD-10-CM | POA: Insufficient documentation

## 2016-04-12 DIAGNOSIS — D473 Essential (hemorrhagic) thrombocythemia: Secondary | ICD-10-CM | POA: Diagnosis present

## 2016-04-12 DIAGNOSIS — D539 Nutritional anemia, unspecified: Secondary | ICD-10-CM | POA: Insufficient documentation

## 2016-04-12 DIAGNOSIS — Z7982 Long term (current) use of aspirin: Secondary | ICD-10-CM | POA: Diagnosis not present

## 2016-04-12 DIAGNOSIS — Z79899 Other long term (current) drug therapy: Secondary | ICD-10-CM | POA: Diagnosis not present

## 2016-04-12 DIAGNOSIS — D510 Vitamin B12 deficiency anemia due to intrinsic factor deficiency: Secondary | ICD-10-CM | POA: Insufficient documentation

## 2016-04-12 DIAGNOSIS — Z8547 Personal history of malignant neoplasm of testis: Secondary | ICD-10-CM | POA: Insufficient documentation

## 2016-04-12 DIAGNOSIS — H409 Unspecified glaucoma: Secondary | ICD-10-CM | POA: Diagnosis not present

## 2016-04-12 LAB — COMPREHENSIVE METABOLIC PANEL
ALBUMIN: 4.6 g/dL (ref 3.5–5.0)
ALT: 27 U/L (ref 17–63)
AST: 17 U/L (ref 15–41)
Alkaline Phosphatase: 44 U/L (ref 38–126)
Anion gap: 8 (ref 5–15)
BUN: 23 mg/dL — AB (ref 6–20)
CHLORIDE: 103 mmol/L (ref 101–111)
CO2: 25 mmol/L (ref 22–32)
Calcium: 9.1 mg/dL (ref 8.9–10.3)
Creatinine, Ser: 0.78 mg/dL (ref 0.61–1.24)
GFR calc Af Amer: >60 mL/min (ref >60)
GFR calc non Af Amer: >60 mL/min (ref >60)
GLUCOSE: 100 mg/dL — AB (ref 65–99)
Potassium: 3.9 mmol/L (ref 3.5–5.1)
SODIUM: 136 mmol/L (ref 135–145)
Total Bilirubin: 2.3 mg/dL — ABNORMAL HIGH (ref 0.3–1.2)
Total Protein: 6.5 g/dL (ref 6.5–8.1)

## 2016-04-12 LAB — CBC WITH DIFFERENTIAL/PLATELET
Basophils Absolute: 0.1 10*3/uL (ref 0–0.1)
Basophils Relative: 1 %
EOS PCT: 1 %
Eosinophils Absolute: 0.1 10*3/uL (ref 0–0.7)
HCT: 41.4 % (ref 40.0–52.0)
Hemoglobin: 15 g/dL (ref 13.0–18.0)
LYMPHS ABS: 0.9 10*3/uL — AB (ref 1.0–3.6)
LYMPHS PCT: 10 %
MCH: 42 pg — AB (ref 26.0–34.0)
MCHC: 36.2 g/dL — AB (ref 32.0–36.0)
MCV: 116.3 fL — AB (ref 80.0–100.0)
MONO ABS: 0.8 10*3/uL (ref 0.2–1.0)
MONOS PCT: 9 %
Neutro Abs: 6.9 10*3/uL — ABNORMAL HIGH (ref 1.4–6.5)
Neutrophils Relative %: 79 %
Platelets: 458 10*3/uL — ABNORMAL HIGH (ref 150–440)
RBC: 3.56 MIL/uL — AB (ref 4.40–5.90)
RDW: 15 % — ABNORMAL HIGH (ref 11.5–14.5)
WBC: 8.8 10*3/uL (ref 3.8–10.6)

## 2016-04-12 NOTE — Assessment & Plan Note (Addendum)
#   ESSENTIAL THROMBOCYTOSIS- Hydrea 1000 milligrams once a day except Monday Friday 500 mg a day. Patient's platelets are are slightly up 458. Patient is on aspirin.  # Pernicious anemia/B12 deficiency on B12 supplementation.   # bil LE swelling- sec ? Lymphedema sec to his testicular cancer surgery. Discussed re: lymphedema referral. Wants to hold off for now.   # follow up labs 6 months.  

## 2016-04-12 NOTE — Progress Notes (Signed)
Patient here to follow-up with thrombocytopenia. He has no medical complaints.

## 2016-04-12 NOTE — Progress Notes (Signed)
[place] OFFICE PROGRESS NOTE Dr.Virk- PCP  No care team member to display   SUMMARY OF HEMATOLOGIC HISTORY: Oncology History   # 2012- MACROCYTIC ANEMIA//B12 def/pernicious anemia [N-LDH; hapto-33; Intrinsic factor Antibody-NEG; PNH flow- Neg];  # July 2015- ESSENTIAL THROMBOCYTOSIS- [CALR MUTATION POSITIVE; Dr.Pandit];BMBx- Aug 2015- myeloproliferative disorder[MRI- for low back pain- signal abnormality s/o MPN] July 2016- platelets- 1028K; MAY 2016-  on Hydrea 1000/d except Mon & Fri [500 mg/d]  # Testicular cancer [phone] s/p RT;  No chemo]-   # dependant edema- ? 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   Patient denies any blood clots. Denies any nausea vomiting or weight loss. No chest pain or shortness of breath or cough. No skin rash. Patient has chronic swelling of the legs- "ever since his surgery for testicular cancer"   REVIEW OF SYSTEMS:  A complete 10 point review of system is done which is negative except mentioned above/history of present illness.   PAST MEDICAL HISTORY :  Past Medical History:  Diagnosis Date  . Anemia   . Dependent edema    chronic  . Essential thrombocytosis ([place])   . GERD (gastroesophageal reflux disease)   . Glaucoma   . Testicular cancer [facility])    when patient ws 71 years old    PAST SURGICAL HISTORY :   Past Surgical History:  Procedure Laterality Date  . CHOLECYSTECTOMY    . ORCHIECTOMY Right    when he was 71 years old    FAMILY HISTORY :   Family History  Problem Relation Age of Onset  . Stroke Mother   . Stroke Father     SOCIAL HISTORY:   Social History   Substance Use Topics  . Smoking status: Never Smoker  . Smokeless tobacco: Never Used  . Alcohol use 0.6 oz/week    1 Cans of beer per week     Comment: every once a while not an every week event    ALLERGIES:  has No Known Allergies.  MEDICATIONS:  Current Outpatient Prescriptions  Medication Sig Dispense Refill  . aspirin 81 MG tablet Take 81 mg by mouth daily.    . brimonidine-timolol (COMBIGAN) 0.2-0.5 % ophthalmic solution Apply 0.2 drops to eye daily. Both eyes    . furosemide (LASIX) 20 MG tablet Take 1 tablet by mouth daily.    . hydroxyurea (HYDREA) 500 MG capsule Take 1 capsule a day Monday and Friday, 2 capsules a day the rest of the week. 144 capsule 3  . pantoprazole (PROTONIX) 40 MG tablet Take 40 mg by mouth daily.    . vitamin B-12 (CYANOCOBALAMIN) 1000 MCG tablet Take 1,000 mcg by mouth daily.     No current facility-administered medications for this visit.     PHYSICAL EXAMINATION:   BP (!) 145/63 (BP Location: Right Arm, Patient Position: Sitting)   Pulse 82   Resp 20   Ht 5\' 5"  (1.651 m)   Wt 171 lb 15.3 oz (78 kg)   BMI 28.62 kg/m   Filed Weights   04/12/16 1049  Weight: 171 lb 15.3 oz (78 kg)    GENERAL: Well-nourished well-developed; Alert, no  distress and comfortable.   EYES: no pallor or icterus OROPHARYNX: no thrush or ulceration; good dentition  NECK: supple, no masses felt LYMPH:  no palpable lymphadenopathy in the cervical, axillary or inguinal regions LUNGS: clear to auscultation and  No wheeze or crackles HEART/CVS: regular rate & rhythm and no murmurs; 1 plus Bil LE edema ABDOMEN:abdomen soft, non-tender and normal bowel sounds Musculoskeletal:no cyanosis of digits and no clubbing  PSYCH: alert & oriented x 3 with fluent speech NEURO: no focal motor/sensory deficits SKIN:  no rashes or significant lesions  LABORATORY DATA:  I have reviewed the data as listed    Component Value Date/Time   NA 136 04/12/2016 1035   K 3.9  04/12/2016 1035   CL 103 04/12/2016 1035   CO2 25 04/12/2016 1035   GLUCOSE 100 (H) 04/12/2016 1035   BUN 23 (H) 04/12/2016 1035   CREATININE 0.78 04/12/2016 1035   CREATININE 0.90 10/25/2012 1124   CALCIUM 9.1 04/12/2016 1035   PROT 6.5 04/12/2016 1035   PROT 6.7 10/25/2012 1124   ALBUMIN 4.6 04/12/2016 1035   ALBUMIN 4.2 10/25/2012 1124   AST 17 04/12/2016 1035   AST 15 10/25/2012 1124   ALT 27 04/12/2016 1035   ALT 24 10/25/2012 1124   ALKPHOS 44 04/12/2016 1035   ALKPHOS 68 10/25/2012 1124   BILITOT 2.3 (H) 04/12/2016 1035   BILITOT 2.5 (H) 10/25/2012 1124   GFRNONAA >60 04/12/2016 1035   GFRNONAA >60 10/25/2012 1124   GFRAA >60 04/12/2016 1035   GFRAA >60 10/25/2012 1124    No results found for: SPEP, UPEP  Lab Results  Component Value Date   WBC 8.8 04/12/2016   NEUTROABS 6.9 (H) 04/12/2016   HGB 15.0 04/12/2016   HCT 41.4 04/12/2016   MCV 116.3 (H) 04/12/2016   PLT 458 (H) 04/12/2016      Chemistry      Component Value Date/Time   NA 136 04/12/2016 1035   K 3.9 04/12/2016 1035   CL 103 04/12/2016 1035   CO2 25 04/12/2016 1035   BUN 23 (H) 04/12/2016 1035   CREATININE 0.78 04/12/2016 1035   CREATININE 0.90 10/25/2012 1124      Component Value Date/Time   CALCIUM 9.1 04/12/2016 1035   ALKPHOS 44 04/12/2016 1035   ALKPHOS 68 10/25/2012 1124   AST 17 04/12/2016 1035   AST 15 10/25/2012 1124   ALT 27 04/12/2016 1035   ALT 24 10/25/2012 1124   BILITOT 2.3 (H) 04/12/2016 1035   BILITOT 2.5 (H) 10/25/2012 1124          ASSESSMENT & PLAN:   Essential thrombocythemia ([place]) # ESSENTIAL THROMBOCYTOSIS- Hydrea 1000 milligrams once a day except Monday Friday 500 mg a day. Patient's platelets are are slightly up 458. Patient is on aspirin.  # Pernicious anemia/B12 deficiency on B12 supplementation.   # bil LE swelling- sec ? Lymphedema sec to his testicular cancer surgery. Discussed re: lymphedema referral. Wants to hold off for now.   # follow up  labs 6 months.     Cammie Sickle, MD 04/12/2016 12:33 PM

## 2016-08-22 ENCOUNTER — Other Ambulatory Visit: Payer: Self-pay | Admitting: Internal Medicine

## 2016-08-22 DIAGNOSIS — D473 Essential (hemorrhagic) thrombocythemia: Secondary | ICD-10-CM

## 2016-10-10 NOTE — Progress Notes (Deleted)
[place] OFFICE PROGRESS NOTE Dr.Virk- PCP  No care team member to display   SUMMARY OF HEMATOLOGIC HISTORY: Oncology History   # 2012- MACROCYTIC ANEMIA//B12 def/pernicious anemia [N-LDH; hapto-33; Intrinsic factor Antibody-NEG; PNH flow- Neg];  # July 2015- ESSENTIAL THROMBOCYTOSIS- [CALR MUTATION POSITIVE; Dr.Pandit];BMBx- Aug 2015- myeloproliferative disorder[MRI- for low back pain- signal abnormality s/o MPN] July 2016- platelets- 1028K; MAY 2016-  on Hydrea 1000/d except Mon & Fri [500 mg/d]  # Testicular cancer [phone] s/p RT;  No chemo]-   # dependant edema- ? 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   Patient denies any blood clots. Denies any nausea vomiting or weight loss. No chest pain or shortness of breath or cough. No skin rash. Patient has chronic swelling of the legs- "ever since his surgery for testicular cancer"   REVIEW OF SYSTEMS:  A complete 10 point review of system is done which is negative except mentioned above/history of present illness.   PAST MEDICAL HISTORY :  Past Medical History:  Diagnosis Date  . Anemia   . Dependent edema    chronic  . Essential thrombocytosis ([place])   . GERD (gastroesophageal reflux disease)   . Glaucoma   . Testicular cancer [facility])    when patient ws 71 years old    PAST SURGICAL HISTORY :   Past Surgical History:  Procedure Laterality Date  . CHOLECYSTECTOMY    . ORCHIECTOMY Right    when he was 71 years old    FAMILY HISTORY :   Family History  Problem Relation Age of Onset  . Stroke Mother   . Stroke Father     SOCIAL HISTORY:   Social History   Substance Use Topics  . Smoking status: Never Smoker  . Smokeless tobacco: Never Used  . Alcohol use 0.6 oz/week    1 Cans of beer per week     Comment: every once a while not an every week event    ALLERGIES:  has No Known Allergies.  MEDICATIONS:  Current Outpatient Prescriptions  Medication Sig Dispense Refill  . aspirin 81 MG tablet Take 81 mg by mouth daily.    . brimonidine-timolol (COMBIGAN) 0.2-0.5 % ophthalmic solution Apply 0.2 drops to eye daily. Both eyes    . furosemide (LASIX) 20 MG tablet Take 1 tablet by mouth daily.    . hydroxyurea (HYDREA) 500 MG capsule TAKE ONE CAPSULE BY MOUTH ON MONDAY AND FRIDAY, AND 2 CAPSULES DAILY THE REST OF THE WEEK 144 capsule 0  . pantoprazole (PROTONIX) 40 MG tablet Take 40 mg by mouth daily.    . vitamin B-12 (CYANOCOBALAMIN) 1000 MCG tablet Take 1,000 mcg by mouth daily.     No current facility-administered medications for this visit.     PHYSICAL EXAMINATION:   There were no vitals taken for this visit.  There were no vitals filed for this visit.  GENERAL: Well-nourished well-developed; Alert, no distress and comfortable.   EYES: no pallor or icterus OROPHARYNX: no thrush or ulceration; good dentition  NECK: supple, no masses felt LYMPH:  no palpable lymphadenopathy in the cervical, axillary or inguinal regions LUNGS: clear to auscultation  and  No wheeze or crackles HEART/CVS: regular rate & rhythm and no murmurs; 1 plus Bil LE edema ABDOMEN:abdomen soft, non-tender and normal bowel sounds Musculoskeletal:no cyanosis of digits and no clubbing  PSYCH: alert & oriented x 3 with fluent speech NEURO: no focal motor/sensory deficits SKIN:  no rashes or significant lesions  LABORATORY DATA:  I have reviewed the data as listed    Component Value Date/Time   NA 136 04/12/2016 1035   K 3.9 04/12/2016 1035   CL 103 04/12/2016 1035   CO2 25 04/12/2016 1035   GLUCOSE 100 (H) 04/12/2016 1035   BUN 23 (H) 04/12/2016 1035    CREATININE 0.78 04/12/2016 1035   CREATININE 0.90 10/25/2012 1124   CALCIUM 9.1 04/12/2016 1035   PROT 6.5 04/12/2016 1035   PROT 6.7 10/25/2012 1124   ALBUMIN 4.6 04/12/2016 1035   ALBUMIN 4.2 10/25/2012 1124   AST 17 04/12/2016 1035   AST 15 10/25/2012 1124   ALT 27 04/12/2016 1035   ALT 24 10/25/2012 1124   ALKPHOS 44 04/12/2016 1035   ALKPHOS 68 10/25/2012 1124   BILITOT 2.3 (H) 04/12/2016 1035   BILITOT 2.5 (H) 10/25/2012 1124   GFRNONAA >60 04/12/2016 1035   GFRNONAA >60 10/25/2012 1124   GFRAA >60 04/12/2016 1035   GFRAA >60 10/25/2012 1124    No results found for: SPEP, UPEP  Lab Results  Component Value Date   WBC 8.8 04/12/2016   NEUTROABS 6.9 (H) 04/12/2016   HGB 15.0 04/12/2016   HCT 41.4 04/12/2016   MCV 116.3 (H) 04/12/2016   PLT 458 (H) 04/12/2016      Chemistry      Component Value Date/Time   NA 136 04/12/2016 1035   K 3.9 04/12/2016 1035   CL 103 04/12/2016 1035   CO2 25 04/12/2016 1035   BUN 23 (H) 04/12/2016 1035   CREATININE 0.78 04/12/2016 1035   CREATININE 0.90 10/25/2012 1124      Component Value Date/Time   CALCIUM 9.1 04/12/2016 1035   ALKPHOS 44 04/12/2016 1035   ALKPHOS 68 10/25/2012 1124   AST 17 04/12/2016 1035   AST 15 10/25/2012 1124   ALT 27 04/12/2016 1035   ALT 24 10/25/2012 1124   BILITOT 2.3 (H) 04/12/2016 1035   BILITOT 2.5 (H) 10/25/2012 1124          ASSESSMENT & PLAN:   No problem-specific Assessment & Plan notes found for this encounter.    Cammie Sickle, MD 10/10/2016 10:40 PM

## 2016-10-10 NOTE — Assessment & Plan Note (Deleted)
#   ESSENTIAL THROMBOCYTOSIS- Hydrea 1000 milligrams once a day except Monday Friday 500 mg a day. Patient's platelets are are slightly up 458. Patient is on aspirin.  # Pernicious anemia/B12 deficiency on B12 supplementation.   # bil LE swelling- sec ? Lymphedema sec to his testicular cancer surgery. Discussed re: lymphedema referral. Wants to hold off for now.   # follow up labs 6 months.

## 2016-10-11 ENCOUNTER — Inpatient Hospital Stay: Payer: Medicare Other

## 2016-10-11 ENCOUNTER — Inpatient Hospital Stay: Payer: Medicare Other | Admitting: Internal Medicine

## 2016-10-25 ENCOUNTER — Inpatient Hospital Stay: Payer: Medicare Other

## 2016-10-25 ENCOUNTER — Inpatient Hospital Stay: Payer: Medicare Other | Attending: Internal Medicine | Admitting: Internal Medicine

## 2016-10-25 VITALS — BP 129/65 | HR 71 | Temp 97.4°F | Resp 16 | Wt 167.2 lb

## 2016-10-25 DIAGNOSIS — Z7982 Long term (current) use of aspirin: Secondary | ICD-10-CM

## 2016-10-25 DIAGNOSIS — Z8547 Personal history of malignant neoplasm of testis: Secondary | ICD-10-CM

## 2016-10-25 DIAGNOSIS — Z9079 Acquired absence of other genital organ(s): Secondary | ICD-10-CM

## 2016-10-25 DIAGNOSIS — D510 Vitamin B12 deficiency anemia due to intrinsic factor deficiency: Secondary | ICD-10-CM | POA: Diagnosis not present

## 2016-10-25 DIAGNOSIS — Z79899 Other long term (current) drug therapy: Secondary | ICD-10-CM | POA: Diagnosis not present

## 2016-10-25 DIAGNOSIS — D473 Essential (hemorrhagic) thrombocythemia: Secondary | ICD-10-CM

## 2016-10-25 DIAGNOSIS — K219 Gastro-esophageal reflux disease without esophagitis: Secondary | ICD-10-CM | POA: Diagnosis not present

## 2016-10-25 DIAGNOSIS — I890 Lymphedema, not elsewhere classified: Secondary | ICD-10-CM

## 2016-10-25 DIAGNOSIS — H409 Unspecified glaucoma: Secondary | ICD-10-CM

## 2016-10-25 LAB — COMPREHENSIVE METABOLIC PANEL
ALK PHOS: 43 U/L (ref 38–126)
ALT: 27 U/L (ref 17–63)
ANION GAP: 6 (ref 5–15)
AST: 20 U/L (ref 15–41)
Albumin: 4.2 g/dL (ref 3.5–5.0)
BUN: 22 mg/dL — ABNORMAL HIGH (ref 6–20)
CALCIUM: 9.1 mg/dL (ref 8.9–10.3)
CO2: 30 mmol/L (ref 22–32)
Chloride: 101 mmol/L (ref 101–111)
Creatinine, Ser: 0.78 mg/dL (ref 0.61–1.24)
Glucose, Bld: 106 mg/dL — ABNORMAL HIGH (ref 65–99)
Potassium: 3.5 mmol/L (ref 3.5–5.1)
SODIUM: 137 mmol/L (ref 135–145)
Total Bilirubin: 2.8 mg/dL — ABNORMAL HIGH (ref 0.3–1.2)
Total Protein: 6.3 g/dL — ABNORMAL LOW (ref 6.5–8.1)

## 2016-10-25 LAB — CBC WITH DIFFERENTIAL/PLATELET
Basophils Absolute: 0.1 10*3/uL (ref 0–0.1)
Basophils Relative: 1 %
EOS PCT: 1 %
Eosinophils Absolute: 0.1 10*3/uL (ref 0–0.7)
HCT: 39.4 % — ABNORMAL LOW (ref 40.0–52.0)
HEMOGLOBIN: 14.5 g/dL (ref 13.0–18.0)
LYMPHS ABS: 0.9 10*3/uL — AB (ref 1.0–3.6)
Lymphocytes Relative: 17 %
MCH: 44 pg — AB (ref 26.0–34.0)
MCHC: 36.8 g/dL — AB (ref 32.0–36.0)
MCV: 119.6 fL — ABNORMAL HIGH (ref 80.0–100.0)
MONOS PCT: 12 %
Monocytes Absolute: 0.6 10*3/uL (ref 0.2–1.0)
Neutro Abs: 3.5 10*3/uL (ref 1.4–6.5)
Neutrophils Relative %: 69 %
Platelets: 362 10*3/uL (ref 150–440)
RBC: 3.29 MIL/uL — ABNORMAL LOW (ref 4.40–5.90)
RDW: 14.5 % (ref 11.5–14.5)
WBC: 5.2 10*3/uL (ref 3.8–10.6)

## 2016-10-25 MED ORDER — HYDROXYUREA 500 MG PO CAPS: ORAL_CAPSULE | ORAL | 3 refills | Status: DC

## 2016-10-25 NOTE — Progress Notes (Signed)
[place] OFFICE PROGRESS NOTE Dr.Virk- PCP  No care team member to display   SUMMARY OF HEMATOLOGIC HISTORY: Oncology History   # 2012- MACROCYTIC ANEMIA//B12 def/pernicious anemia [N-LDH; hapto-33; Intrinsic factor Antibody-NEG; PNH flow- Neg];  # July 2015- ESSENTIAL THROMBOCYTOSIS- [CALR MUTATION POSITIVE; Dr.Pandit];BMBx- Aug 2015- myeloproliferative disorder[MRI- for low back pain- signal abnormality s/o MPN] July 2016- platelets- 1028K; MAY 2016-  on Hydrea 1000/d except Mon & Fri [500 mg/d]  # Testicular cancer [phone] s/p RT;  No chemo]-   # dependant edema- ? 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   Patient denies any blood clots. Chronic swelling in the legs -since his testicular cancer surgery. Denies any nausea vomiting or weight loss. No chest pain or shortness of breath or cough. No skin rash.   REVIEW OF SYSTEMS:  A complete 10 point review of system is done which is negative except mentioned above/history of present illness.   PAST MEDICAL HISTORY :  Past Medical History:  Diagnosis Date  . Anemia   . Dependent edema    chronic  . Essential thrombocytosis ([place])   . GERD (gastroesophageal reflux disease)   . Glaucoma   . Testicular cancer [facility])    when patient ws 71 years old    PAST SURGICAL HISTORY :   Past Surgical History:  Procedure Laterality Date  . CHOLECYSTECTOMY    . ORCHIECTOMY Right    when he was 71 years old    FAMILY HISTORY :   Family History  Problem Relation Age of Onset  . Stroke Mother   . Stroke Father     SOCIAL HISTORY:   Social History  Substance Use  Topics  . Smoking status: Never Smoker  . Smokeless tobacco: Never Used  . Alcohol use 0.6 oz/week    1 Cans of beer per week     Comment: every once a while not an every week event    ALLERGIES:  has No Known Allergies.  MEDICATIONS:  Current Outpatient Prescriptions  Medication Sig Dispense Refill  . aspirin 81 MG tablet Take 81 mg by mouth daily.    . brimonidine-timolol (COMBIGAN) 0.2-0.5 % ophthalmic solution Apply 0.2 drops to eye daily. Both eyes    . furosemide (LASIX) 20 MG tablet Take 1 tablet by mouth daily.    . hydroxyurea (HYDREA) 500 MG capsule TAKE ONE CAPSULE BY MOUTH ON MONDAY AND FRIDAY, AND 2 CAPSULES DAILY THE REST OF THE WEEK 180 capsule 3  . pantoprazole (PROTONIX) 40 MG tablet Take 40 mg by mouth daily.    . vitamin B-12 (CYANOCOBALAMIN) 1000 MCG tablet Take 1,000 mcg by mouth daily.     No current facility-administered medications for this visit.     PHYSICAL EXAMINATION:   BP 129/65 (BP Location: Left Arm, Patient Position: Sitting)   Pulse 71   Temp (!) 97.4 F (36.3 C) (Tympanic)   Resp 16   Wt 167 lb 3.5 oz (75.8 kg)   BMI 27.83 kg/m   Filed Weights   10/25/16 0903  Weight: 167 lb 3.5 oz (75.8 kg)    GENERAL: Well-nourished well-developed; Alert, no distress and  comfortable.   EYES: no pallor or icterus OROPHARYNX: no thrush or ulceration; good dentition  NECK: supple, no masses felt LYMPH:  no palpable lymphadenopathy in the cervical, axillary or inguinal regions LUNGS: clear to auscultation and  No wheeze or crackles HEART/CVS: regular rate & rhythm and no murmurs; 1 plus Bil LE edema ABDOMEN:abdomen soft, non-tender and normal bowel sounds Musculoskeletal:no cyanosis of digits and no clubbing  PSYCH: alert & oriented x 3 with fluent speech NEURO: no focal motor/sensory deficits SKIN:  no rashes or significant lesions  LABORATORY DATA:  I have reviewed the data as listed    Component Value Date/Time   NA 137 10/25/2016 0844   K  3.5 10/25/2016 0844   CL 101 10/25/2016 0844   CO2 30 10/25/2016 0844   GLUCOSE 106 (H) 10/25/2016 0844   BUN 22 (H) 10/25/2016 0844   CREATININE 0.78 10/25/2016 0844   CREATININE 0.90 10/25/2012 1124   CALCIUM 9.1 10/25/2016 0844   PROT 6.3 (L) 10/25/2016 0844   PROT 6.7 10/25/2012 1124   ALBUMIN 4.2 10/25/2016 0844   ALBUMIN 4.2 10/25/2012 1124   AST 20 10/25/2016 0844   AST 15 10/25/2012 1124   ALT 27 10/25/2016 0844   ALT 24 10/25/2012 1124   ALKPHOS 43 10/25/2016 0844   ALKPHOS 68 10/25/2012 1124   BILITOT 2.8 (H) 10/25/2016 0844   BILITOT 2.5 (H) 10/25/2012 1124   GFRNONAA >60 10/25/2016 0844   GFRNONAA >60 10/25/2012 1124   GFRAA >60 10/25/2016 0844   GFRAA >60 10/25/2012 1124    No results found for: SPEP, UPEP  Lab Results  Component Value Date   WBC 5.2 10/25/2016   NEUTROABS 3.5 10/25/2016   HGB 14.5 10/25/2016   HCT 39.4 (L) 10/25/2016   MCV 119.6 (H) 10/25/2016   PLT 362 10/25/2016      Chemistry      Component Value Date/Time   NA 137 10/25/2016 0844   K 3.5 10/25/2016 0844   CL 101 10/25/2016 0844   CO2 30 10/25/2016 0844   BUN 22 (H) 10/25/2016 0844   CREATININE 0.78 10/25/2016 0844   CREATININE 0.90 10/25/2012 1124      Component Value Date/Time   CALCIUM 9.1 10/25/2016 0844   ALKPHOS 43 10/25/2016 0844   ALKPHOS 68 10/25/2012 1124   AST 20 10/25/2016 0844   AST 15 10/25/2012 1124   ALT 27 10/25/2016 0844   ALT 24 10/25/2012 1124   BILITOT 2.8 (H) 10/25/2016 0844   BILITOT 2.5 (H) 10/25/2012 1124          ASSESSMENT & PLAN:   Essential thrombocythemia ([place]) # ESSENTIAL THROMBOCYTOSIS- Hydrea 1000 milligrams once a day except Monday Friday 500 mg a day. Normal-WBC/Hb- Patient's platelets are 362 Patient is on aspirin. New script given.   # Pernicious anemia/B12 deficiency on B12 supplementation.   # bil LE swelling- sec ? Lymphedema sec to his testicular cancer surgery. Discussed re: lymphedema referral. On stockings.   #  elevated bili- ? Gilbert syndrome. Monitor for now.   # follow up labs 6 months/     Cammie Sickle, MD 11/02/2016 8:24 AM

## 2016-10-25 NOTE — Assessment & Plan Note (Addendum)
#   ESSENTIAL THROMBOCYTOSIS- Hydrea 1000 milligrams once a day except Monday Friday 500 mg a day. Normal-WBC/Hb- Patient's platelets are 362 Patient is on aspirin. New script given.   # Pernicious anemia/B12 deficiency on B12 supplementation.   # bil LE swelling- sec ? Lymphedema sec to his testicular cancer surgery. Discussed re: lymphedema referral. On stockings.   # elevated bili- ? Gilbert syndrome. Monitor for now.   # follow up labs 6 months/

## 2017-04-25 ENCOUNTER — Inpatient Hospital Stay: Payer: Medicare Other | Attending: Internal Medicine

## 2017-04-25 ENCOUNTER — Other Ambulatory Visit: Payer: Self-pay

## 2017-04-25 ENCOUNTER — Encounter: Payer: Self-pay | Admitting: Internal Medicine

## 2017-04-25 ENCOUNTER — Inpatient Hospital Stay (HOSPITAL_BASED_OUTPATIENT_CLINIC_OR_DEPARTMENT_OTHER): Payer: Medicare Other | Admitting: Internal Medicine

## 2017-04-25 VITALS — BP 140/50 | HR 73 | Temp 97.9°F | Resp 20 | Ht 65.0 in | Wt 169.5 lb

## 2017-04-25 DIAGNOSIS — D473 Essential (hemorrhagic) thrombocythemia: Secondary | ICD-10-CM | POA: Insufficient documentation

## 2017-04-25 DIAGNOSIS — D510 Vitamin B12 deficiency anemia due to intrinsic factor deficiency: Secondary | ICD-10-CM

## 2017-04-25 DIAGNOSIS — Z79899 Other long term (current) drug therapy: Secondary | ICD-10-CM | POA: Insufficient documentation

## 2017-04-25 DIAGNOSIS — D539 Nutritional anemia, unspecified: Secondary | ICD-10-CM | POA: Insufficient documentation

## 2017-04-25 DIAGNOSIS — Z7982 Long term (current) use of aspirin: Secondary | ICD-10-CM | POA: Diagnosis not present

## 2017-04-25 DIAGNOSIS — R5383 Other fatigue: Secondary | ICD-10-CM

## 2017-04-25 DIAGNOSIS — M7989 Other specified soft tissue disorders: Secondary | ICD-10-CM | POA: Diagnosis not present

## 2017-04-25 LAB — CBC WITH DIFFERENTIAL/PLATELET
BASOS ABS: 0.1 10*3/uL (ref 0–0.1)
Basophils Relative: 1 %
Eosinophils Absolute: 0 10*3/uL (ref 0–0.7)
Eosinophils Relative: 1 %
HCT: 42.2 % (ref 40.0–52.0)
Hemoglobin: 15.4 g/dL (ref 13.0–18.0)
LYMPHS PCT: 17 %
Lymphs Abs: 1.1 10*3/uL (ref 1.0–3.6)
MCH: 42.8 pg — ABNORMAL HIGH (ref 26.0–34.0)
MCHC: 36.4 g/dL — ABNORMAL HIGH (ref 32.0–36.0)
MCV: 117.6 fL — AB (ref 80.0–100.0)
Monocytes Absolute: 0.7 10*3/uL (ref 0.2–1.0)
Monocytes Relative: 10 %
NEUTROS PCT: 71 %
Neutro Abs: 4.5 10*3/uL (ref 1.4–6.5)
PLATELETS: 398 10*3/uL (ref 150–440)
RBC: 3.59 MIL/uL — AB (ref 4.40–5.90)
RDW: 14.8 % — ABNORMAL HIGH (ref 11.5–14.5)
WBC: 6.3 10*3/uL (ref 3.8–10.6)

## 2017-04-25 LAB — COMPREHENSIVE METABOLIC PANEL
ALT: 27 U/L (ref 17–63)
AST: 18 U/L (ref 15–41)
Albumin: 4.5 g/dL (ref 3.5–5.0)
Alkaline Phosphatase: 43 U/L (ref 38–126)
Anion gap: 7 (ref 5–15)
BILIRUBIN TOTAL: 2.1 mg/dL — AB (ref 0.3–1.2)
BUN: 20 mg/dL (ref 6–20)
CO2: 29 mmol/L (ref 22–32)
Calcium: 8.9 mg/dL (ref 8.9–10.3)
Chloride: 100 mmol/L — ABNORMAL LOW (ref 101–111)
Creatinine, Ser: 0.74 mg/dL (ref 0.61–1.24)
GFR calc Af Amer: >60 mL/min (ref >60)
Glucose, Bld: 110 mg/dL — ABNORMAL HIGH (ref 65–99)
POTASSIUM: 3.4 mmol/L — AB (ref 3.5–5.1)
Sodium: 136 mmol/L (ref 135–145)
TOTAL PROTEIN: 6.8 g/dL (ref 6.5–8.1)

## 2017-04-25 LAB — TSH: TSH: 2.774 u[IU]/mL (ref 0.350–4.500)

## 2017-04-25 LAB — LACTATE DEHYDROGENASE: LDH: 160 U/L (ref 98–192)

## 2017-04-25 NOTE — Assessment & Plan Note (Addendum)
#   ESSENTIAL THROMBOCYTOSIS- Hydrea 1000 milligrams once a day except Monday Friday 500 mg a day. Normal-WBC/Hb- Patient's platelets are 398. Patient is on aspirin.   # Pernicious anemia/B12 deficiency- recommend sublingual B12 1000 mcg/day.   # bil LE swelling- sec ? Lymphedema sec to his testicular cancer surgery. Stable.    # Fatigue- add TSH. Will call if abnormal.   # elevated bili- ? Gilbert syndrome. Monitor for now.   # follow up labs 6 months/ labs.   Addendum: Please inform pt that his TSH is normal. He will need to follow up with pcp re: on going fatigue if that does not improve after taking sublingual b12 pills.

## 2017-04-25 NOTE — Progress Notes (Signed)
[place] OFFICE PROGRESS NOTE Dr.Virk- PCP  Patient Care Team: Barbaraann Boys, MD as PCP - General (Pediatrics)   SUMMARY OF HEMATOLOGIC HISTORY: Oncology History   # 2012- MACROCYTIC ANEMIA//B12 def/pernicious anemia [N-LDH; hapto-33; Intrinsic factor Antibody-NEG; PNH flow- Neg];  # July 2015- ESSENTIAL THROMBOCYTOSIS- [CALR MUTATION POSITIVE; Dr.Pandit];BMBx- Aug 2015- myeloproliferative disorder[MRI- for low back pain- signal abnormality s/o MPN] July 2016- platelets- 1028K; MAY 2016-  on Hydrea 1000/d except Mon & Fri [500 mg/d]  # Testicular cancer [phone] s/p RT;  No chemo]-   # dependant edema- ? 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   Patient complains of mild fatigue.  Otherwise denies any headaches.  Denies any weight loss.  Appetite is good.   Denies any nausea vomiting or weight loss. No chest pain or shortness of breath or cough. No skin rash.  Patient denies any blood clots. Chronic swelling in the legs -since his testicular cancer surgery.  REVIEW OF SYSTEMS:  A complete 10 point review of system is done which is negative except mentioned above/history of present illness.   PAST MEDICAL HISTORY :  Past Medical History:  Diagnosis Date  . Anemia   . Dependent edema    chronic  . Essential thrombocytosis ([place])   . GERD (gastroesophageal reflux disease)   . Glaucoma   . Testicular cancer [facility])    when patient ws 72 years old    PAST SURGICAL HISTORY :   Past Surgical History:  Procedure Laterality Date  . CHOLECYSTECTOMY    . ORCHIECTOMY Right    when he was 72 years old    FAMILY  HISTORY :   Family History  Problem Relation Age of Onset  . Stroke Mother   . Stroke Father     SOCIAL HISTORY:   Social History   Tobacco Use  . Smoking status: Never Smoker  . Smokeless tobacco: Never Used  Substance Use Topics  . Alcohol use: Yes    Alcohol/week: 0.6 oz    Types: 1 Cans of beer per week    Comment: every once a while not an every week event  . Drug use: No    ALLERGIES:  has No Known Allergies.  MEDICATIONS:  Current Outpatient Medications  Medication Sig Dispense Refill  . aspirin 81 MG tablet Take 81 mg by mouth daily.    . brimonidine-timolol (COMBIGAN) 0.2-0.5 % ophthalmic solution Apply 0.2 drops to eye daily. Both eyes    . furosemide (LASIX) 20 MG tablet Take 1 tablet by mouth daily.    . hydroxyurea (HYDREA) 500 MG capsule TAKE ONE CAPSULE BY MOUTH ON MONDAY AND FRIDAY, AND 2 CAPSULES DAILY THE REST OF THE WEEK 180 capsule 3  . pantoprazole (PROTONIX) 40 MG tablet Take 40 mg by mouth daily.    . vitamin B-12 (CYANOCOBALAMIN) 1000 MCG tablet Take 1,000 mcg by mouth daily.     No current facility-administered medications for this visit.     PHYSICAL EXAMINATION:   BP (!) 140/50 (BP Location: Left Arm, Patient Position: Sitting)   Pulse 73   Temp 97.9 F (36.6 C) (Oral)   Resp  20   Ht 5\' 5"  (1.651 m)   Wt 169 lb 8.5 oz (76.9 kg)   BMI 28.21 kg/m   Filed Weights   04/25/17 0831  Weight: 169 lb 8.5 oz (76.9 kg)    GENERAL: Well-nourished well-developed; Alert, no distress and comfortable.   EYES: no pallor or icterus OROPHARYNX: no thrush or ulceration; good dentition  NECK: supple, no masses felt LYMPH:  no palpable lymphadenopathy in the cervical, axillary or inguinal regions LUNGS: clear to auscultation and  No wheeze or crackles HEART/CVS: regular rate & rhythm and no murmurs; 1 plus Bil LE edema ABDOMEN:abdomen soft, non-tender and normal bowel sounds Musculoskeletal:no cyanosis of digits and no clubbing  PSYCH: alert &  oriented x 3 with fluent speech NEURO: no focal motor/sensory deficits SKIN:  no rashes or significant lesions  LABORATORY DATA:  I have reviewed the data as listed    Component Value Date/Time   NA 136 04/25/2017 0800   K 3.4 (L) 04/25/2017 0800   CL 100 (L) 04/25/2017 0800   CO2 29 04/25/2017 0800   GLUCOSE 110 (H) 04/25/2017 0800   BUN 20 04/25/2017 0800   CREATININE 0.74 04/25/2017 0800   CREATININE 0.90 10/25/2012 1124   CALCIUM 8.9 04/25/2017 0800   PROT 6.8 04/25/2017 0800   PROT 6.7 10/25/2012 1124   ALBUMIN 4.5 04/25/2017 0800   ALBUMIN 4.2 10/25/2012 1124   AST 18 04/25/2017 0800   AST 15 10/25/2012 1124   ALT 27 04/25/2017 0800   ALT 24 10/25/2012 1124   ALKPHOS 43 04/25/2017 0800   ALKPHOS 68 10/25/2012 1124   BILITOT 2.1 (H) 04/25/2017 0800   BILITOT 2.5 (H) 10/25/2012 1124   GFRNONAA >60 04/25/2017 0800   GFRNONAA >60 10/25/2012 1124   GFRAA >60 04/25/2017 0800   GFRAA >60 10/25/2012 1124    No results found for: SPEP, UPEP  Lab Results  Component Value Date   WBC 6.3 04/25/2017   NEUTROABS 4.5 04/25/2017   HGB 15.4 04/25/2017   HCT 42.2 04/25/2017   MCV 117.6 (H) 04/25/2017   PLT 398 04/25/2017      Chemistry      Component Value Date/Time   NA 136 04/25/2017 0800   K 3.4 (L) 04/25/2017 0800   CL 100 (L) 04/25/2017 0800   CO2 29 04/25/2017 0800   BUN 20 04/25/2017 0800   CREATININE 0.74 04/25/2017 0800   CREATININE 0.90 10/25/2012 1124      Component Value Date/Time   CALCIUM 8.9 04/25/2017 0800   ALKPHOS 43 04/25/2017 0800   ALKPHOS 68 10/25/2012 1124   AST 18 04/25/2017 0800   AST 15 10/25/2012 1124   ALT 27 04/25/2017 0800   ALT 24 10/25/2012 1124   BILITOT 2.1 (H) 04/25/2017 0800   BILITOT 2.5 (H) 10/25/2012 1124          ASSESSMENT & PLAN:   Essential thrombocythemia ([place]) # ESSENTIAL THROMBOCYTOSIS- Hydrea 1000 milligrams once a day except Monday Friday 500 mg a day. Normal-WBC/Hb- Patient's platelets are 398. Patient  is on aspirin.   # Pernicious anemia/B12 deficiency- recommend sublingual B12 1000 mcg/day.   # bil LE swelling- sec ? Lymphedema sec to his testicular cancer surgery. Stable.    # Fatigue- add TSH. Will call if abnormal.   # elevated bili- ? Gilbert syndrome. Monitor for now.   # follow up labs 6 months/ labs.     Cammie Sickle, MD 04/25/2017 1:39 PM

## 2017-07-18 ENCOUNTER — Other Ambulatory Visit: Payer: Self-pay | Admitting: Nurse Practitioner

## 2017-07-18 DIAGNOSIS — K219 Gastro-esophageal reflux disease without esophagitis: Secondary | ICD-10-CM

## 2017-07-25 ENCOUNTER — Ambulatory Visit
Admission: RE | Admit: 2017-07-25 | Discharge: 2017-07-25 | Disposition: A | Discharge status: Home / Self Care | Payer: Medicare Other | Source: Ambulatory Visit | Attending: Nurse Practitioner | Admitting: Nurse Practitioner

## 2017-07-25 DIAGNOSIS — K210 Gastro-esophageal reflux disease with esophagitis: Secondary | ICD-10-CM | POA: Diagnosis not present

## 2017-07-25 DIAGNOSIS — K449 Diaphragmatic hernia without obstruction or gangrene: Secondary | ICD-10-CM | POA: Diagnosis not present

## 2017-07-25 DIAGNOSIS — K219 Gastro-esophageal reflux disease without esophagitis: Secondary | ICD-10-CM

## 2017-07-26 ENCOUNTER — Ambulatory Visit: Admission: RE | Admit: 2017-07-26 | Payer: Medicare Other | Source: Ambulatory Visit

## 2017-10-05 ENCOUNTER — Encounter: Payer: Self-pay | Admitting: *Deleted

## 2017-10-06 ENCOUNTER — Encounter
Admission: RE | Disposition: A | Discharge status: Home / Self Care | Payer: Self-pay | Source: Ambulatory Visit | Attending: Unknown Physician Specialty

## 2017-10-06 ENCOUNTER — Ambulatory Visit: Payer: Medicare Other | Admitting: Anesthesiology

## 2017-10-06 ENCOUNTER — Ambulatory Visit
Admission: RE | Admit: 2017-10-06 | Discharge: 2017-10-06 | Disposition: A | Discharge status: Home / Self Care | Payer: Medicare Other | Source: Ambulatory Visit | Attending: Unknown Physician Specialty | Admitting: Unknown Physician Specialty

## 2017-10-06 DIAGNOSIS — Z7982 Long term (current) use of aspirin: Secondary | ICD-10-CM | POA: Insufficient documentation

## 2017-10-06 DIAGNOSIS — M1990 Unspecified osteoarthritis, unspecified site: Secondary | ICD-10-CM | POA: Insufficient documentation

## 2017-10-06 DIAGNOSIS — Z9049 Acquired absence of other specified parts of digestive tract: Secondary | ICD-10-CM | POA: Insufficient documentation

## 2017-10-06 DIAGNOSIS — Z823 Family history of stroke: Secondary | ICD-10-CM | POA: Diagnosis not present

## 2017-10-06 DIAGNOSIS — Z8547 Personal history of malignant neoplasm of testis: Secondary | ICD-10-CM | POA: Diagnosis not present

## 2017-10-06 DIAGNOSIS — K2980 Duodenitis without bleeding: Secondary | ICD-10-CM | POA: Insufficient documentation

## 2017-10-06 DIAGNOSIS — K210 Gastro-esophageal reflux disease with esophagitis: Secondary | ICD-10-CM | POA: Insufficient documentation

## 2017-10-06 DIAGNOSIS — Z9079 Acquired absence of other genital organ(s): Secondary | ICD-10-CM | POA: Diagnosis not present

## 2017-10-06 DIAGNOSIS — R609 Edema, unspecified: Secondary | ICD-10-CM | POA: Insufficient documentation

## 2017-10-06 DIAGNOSIS — H409 Unspecified glaucoma: Secondary | ICD-10-CM | POA: Diagnosis not present

## 2017-10-06 DIAGNOSIS — K2270 Barrett's esophagus without dysplasia: Secondary | ICD-10-CM | POA: Diagnosis not present

## 2017-10-06 DIAGNOSIS — D473 Essential (hemorrhagic) thrombocythemia: Secondary | ICD-10-CM | POA: Insufficient documentation

## 2017-10-06 DIAGNOSIS — K317 Polyp of stomach and duodenum: Secondary | ICD-10-CM | POA: Diagnosis present

## 2017-10-06 DIAGNOSIS — D649 Anemia, unspecified: Secondary | ICD-10-CM | POA: Diagnosis not present

## 2017-10-06 DIAGNOSIS — Z79899 Other long term (current) drug therapy: Secondary | ICD-10-CM | POA: Insufficient documentation

## 2017-10-06 DIAGNOSIS — R011 Cardiac murmur, unspecified: Secondary | ICD-10-CM | POA: Diagnosis not present

## 2017-10-06 HISTORY — DX: Barrett's esophagus without dysplasia: K22.70

## 2017-10-06 HISTORY — DX: Unspecified osteoarthritis, unspecified site: M19.90

## 2017-10-06 HISTORY — DX: Essential (primary) hypertension: I10

## 2017-10-06 HISTORY — DX: Hemochromatosis, unspecified: E83.119

## 2017-10-06 HISTORY — PX: ESOPHAGOGASTRODUODENOSCOPY (EGD) WITH PROPOFOL: SHX5813

## 2017-10-06 HISTORY — DX: Cardiac murmur, unspecified: R01.1

## 2017-10-06 SURGERY — ESOPHAGOGASTRODUODENOSCOPY (EGD) WITH PROPOFOL
Anesthesia: General

## 2017-10-06 MED ORDER — SODIUM CHLORIDE 0.9 % IV SOLN: INTRAVENOUS | Status: DC

## 2017-10-06 MED ORDER — LIDOCAINE HCL (PF) 2 % IJ SOLN
INTRAMUSCULAR | Status: AC
  Filled 2017-10-06: qty 10

## 2017-10-06 MED ORDER — SODIUM CHLORIDE 0.9 % IV SOLN
INTRAVENOUS | Status: DC
  Administered 2017-10-06: 1000 mL via INTRAVENOUS

## 2017-10-06 MED ORDER — PROPOFOL 10 MG/ML IV BOLUS
INTRAVENOUS | Status: DC | PRN
  Administered 2017-10-06: 20 mg via INTRAVENOUS
  Administered 2017-10-06: 50 mg via INTRAVENOUS
  Administered 2017-10-06: 20 mg via INTRAVENOUS
  Administered 2017-10-06: 50 mg via INTRAVENOUS
  Administered 2017-10-06: 10 mg via INTRAVENOUS
  Administered 2017-10-06: 50 mg via INTRAVENOUS

## 2017-10-06 MED ORDER — PROPOFOL 10 MG/ML IV BOLUS
INTRAVENOUS | Status: AC
  Filled 2017-10-06: qty 20

## 2017-10-06 NOTE — Anesthesia Preprocedure Evaluation (Signed)
Anesthesia Evaluation  Patient identified by MRN, date of birth, ID band Patient awake    Reviewed: Allergy & Precautions, NPO status , Patient's Chart, lab work & pertinent test results  History of Anesthesia Complications Negative for: history of anesthetic complications  Airway Mallampati: II       Dental   Pulmonary neg sleep apnea, neg COPD,           Cardiovascular (-) hypertension(-) Past MI and (-) CHF (-) dysrhythmias + Valvular Problems/Murmurs (murmur)      Neuro/Psych neg Seizures    GI/Hepatic Neg liver ROS, GERD  Medicated and Poorly Controlled,  Endo/Other  neg diabetes  Renal/GU negative Renal ROS     Musculoskeletal   Abdominal   Peds  Hematology   Anesthesia Other Findings   Reproductive/Obstetrics                            Anesthesia Physical Anesthesia Plan  ASA: II  Anesthesia Plan: General   Post-op Pain Management:    Induction: Intravenous  PONV Risk Score and Plan: 2 and Propofol infusion and TIVA  Airway Management Planned: Nasal Cannula  Additional Equipment:   Intra-op Plan:   Post-operative Plan:   Informed Consent: I have reviewed the patients History and Physical, chart, labs and discussed the procedure including the risks, benefits and alternatives for the proposed anesthesia with the patient or authorized representative who has indicated his/her understanding and acceptance.     Plan Discussed with:   Anesthesia Plan Comments:         Anesthesia Quick Evaluation

## 2017-10-06 NOTE — Anesthesia Post-op Follow-up Note (Signed)
Anesthesia QCDR form completed.        

## 2017-10-06 NOTE — Anesthesia Postprocedure Evaluation (Signed)
Anesthesia Post Note  Patient: Drew Gardner  Procedure(s) Performed: ESOPHAGOGASTRODUODENOSCOPY (EGD) WITH PROPOFOL (N/A )  Patient location during evaluation: Endoscopy Anesthesia Type: General Level of consciousness: awake and alert Pain management: pain level controlled Vital Signs Assessment: post-procedure vital signs reviewed and stable Respiratory status: spontaneous breathing and respiratory function stable Cardiovascular status: stable Anesthetic complications: no     Last Vitals:  Vitals:   10/06/17 1017 10/06/17 1120  BP: (!) 149/67 (!) 124/53  Pulse: 69 67  Resp: 16 14  Temp: 36.4 C (!) 35.7 C  SpO2: 100% 100%    Last Pain:  Vitals:   10/06/17 1120  TempSrc: Tympanic  PainSc:                  KEPHART,WILLIAM K

## 2017-10-06 NOTE — Transfer of Care (Signed)
Immediate Anesthesia Transfer of Care Note  Patient: Drew Gardner  Procedure(s) Performed: ESOPHAGOGASTRODUODENOSCOPY (EGD) WITH PROPOFOL (N/A )  Patient Location: PACU and Endoscopy Unit  Anesthesia Type:General  Level of Consciousness: sedated  Airway & Oxygen Therapy: Patient Spontanous Breathing and Patient connected to nasal cannula oxygen  Post-op Assessment: Report given to RN and Post -op Vital signs reviewed and stable  Post vital signs: stable  Last Vitals:  Vitals Value Taken Time  BP 124/53 10/06/2017 11:20 AM  Temp 35.7 C 10/06/2017 11:20 AM  Pulse 65 10/06/2017 11:33 AM  Resp 12 10/06/2017 11:33 AM  SpO2 100 % 10/06/2017 11:33 AM  Vitals shown include unvalidated device data.  Last Pain:  Vitals:   10/06/17 1120  TempSrc: Tympanic  PainSc:          Complications: No apparent anesthesia complications

## 2017-10-06 NOTE — Op Note (Signed)
[facility] Patient Name: Drew Gardner Procedure Date: 10/06/2017 10:56 AM MRN: [id number] Account #: [id number] Date of Birth: Dec 29, 1945 Admit Type: Outpatient Age: 72 Room: [facility] ENDO ROOM 1 Gender: Male Note Status: Finalized Procedure:            Upper GI endoscopy Indications:          follow up duodenal polyps Providers:            Manya Silvas, MD Referring MD:         Ane Payment, MD (Referring MD) Medicines:            Propofol per Anesthesia Complications:        No immediate complications. Procedure:            Pre-Anesthesia Assessment:                       - After reviewing the risks and benefits, the patient                        was deemed in satisfactory condition to undergo the                        procedure.                       After obtaining informed consent, the endoscope was                        passed under direct vision. Throughout the procedure,                        the patient's blood pressure, pulse, and oxygen                        saturations were monitored continuously. The Endoscope                        was introduced through the mouth, and advanced to the                        second part of duodenum. The upper GI endoscopy was                        accomplished without difficulty. The patient tolerated                        the procedure well. Findings:      There were esophageal mucosal changes consistent with short-segment       Barrett's esophagus present at the gastroesophageal junction. The       maximum longitudinal extent of these mucosal changes was 2 cm in length.       Mucosa was biopsied with a cold forceps for histology. One specimen       bottle was sent to pathology.      Multiple medium sessile polyps with no bleeding and no stigmata of       recent bleeding were found in the gastric body.      A few diminutive nodules with a diffuse distribution were found in the   second portion of the duodenum. Biopsies were taken with a cold forceps  for histology.      The exam was otherwise without abnormality. Impression:           - Esophageal mucosal changes consistent with                        short-segment Barrett's esophagus. Biopsied.                       - Multiple gastric polyps.                       - Nodule found in the duodenum. Biopsied.                       - The examination was otherwise normal. Recommendation:       - Await pathology results. Manya Silvas, MD 10/06/2017 11:27:41 AM This report has been signed electronically. Number of Addenda: 0 Note Initiated On: 10/06/2017 10:56 AM      [facility]

## 2017-10-06 NOTE — H&P (Signed)
Primary Care Physician:  Barbaraann Boys, MD Primary Gastroenterologist:  Dr. Vira Agar  Pre-Procedure History & Physical: HPI:  Drew Gardner is a 72 y.o. male is here for an endoscopy.   Past Medical History:  Diagnosis Date  . Anemia   . Arthritis   . Barrett's esophagus   . Dependent edema    chronic  . Essential thrombocytosis (Drew Gardner)   . Flow murmur   . GERD (gastroesophageal reflux disease)   . Glaucoma   . Hemochromatosis   . Hypertension   . Testicular cancer [facility])    when patient ws 72 years old    Past Surgical History:  Procedure Laterality Date  . CHOLECYSTECTOMY    . ORCHIECTOMY Right    when he was 72 years old    Prior to Admission medications   Medication Sig Start Date End Date Taking? Authorizing Provider  aspirin 81 MG tablet Take 81 mg by mouth daily.   Yes [provider]  brimonidine-timolol (COMBIGAN) 0.2-0.5 % ophthalmic solution Apply 0.2 drops to eye daily. Both eyes 07/01/14  Yes [provider]  furosemide (LASIX) 20 MG tablet Take 1 tablet by mouth daily. 04/05/16  Yes [provider]  hydroxyurea (HYDREA) 500 MG capsule TAKE ONE CAPSULE BY MOUTH ON MONDAY AND FRIDAY, AND 2 CAPSULES DAILY THE REST OF THE WEEK 10/25/16  Yes Cammie Sickle, MD  pantoprazole (PROTONIX) 40 MG tablet Take 40 mg by mouth daily.   Yes [provider]  potassium chloride (KLOR-CON) 20 MEQ packet Take by mouth 2 (two) times daily.   Yes [provider]  sucralfate (CARAFATE) 1 g tablet Take 1 g by mouth 4 (four) times daily -  with meals and at bedtime.   Yes [provider]  vitamin B-12 (CYANOCOBALAMIN) 1000 MCG tablet Take 1,000 mcg by mouth daily.   Yes [provider]    Allergies as of 08/01/2017  . (No Known Allergies)    Family History  Problem Relation Age of Onset  . Stroke Mother   . Stroke Father     Social History   Socioeconomic History  . Marital status: Married    Spouse  name: Not on file  . Number of children: Not on file  . Years of education: Not on file  . Highest education level: Not on file  Occupational History  . Not on file  Social Needs  . Financial resource strain: Not on file  . Food insecurity:    Worry: Not on file    Inability: Not on file  . Transportation needs:    Medical: Not on file    Non-medical: Not on file  Tobacco Use  . Smoking status: Never Smoker  . Smokeless tobacco: Never Used  Substance and Sexual Activity  . Alcohol use: Yes    Alcohol/week: 0.6 oz    Types: 1 Cans of beer per week    Comment: every once a while not an every week event  . Drug use: No  . Sexual activity: Not on file  Lifestyle  . Physical activity:    Days per week: Not on file    Minutes per session: Not on file  . Stress: Not on file  Relationships  . Social connections:    Talks on phone: Not on file    Gets together: Not on file    Attends religious service: Not on file    Active member of club or organization: Not on file  Attends meetings of clubs or organizations: Not on file    Relationship status: Not on file  . Intimate partner violence:    Fear of current or ex partner: Not on file    Emotionally abused: Not on file    Physically abused: Not on file    Forced sexual activity: Not on file  Other Topics Concern  . Not on file  Social History Narrative  . Not on file    Review of Systems: See HPI, otherwise negative ROS  Physical Exam: BP (!) 149/67   Pulse 69   Temp 97.6 F (36.4 C) (Tympanic)   Resp 16   Ht 5\' 5"  (1.651 m)   Wt 73.5 kg (162 lb)   SpO2 100%   BMI 26.96 kg/m  General:   Alert,  pleasant and cooperative in NAD Head:  Normocephalic and atraumatic. Neck:  Supple; no masses or thyromegaly. Lungs:  Clear throughout to auscultation.    Heart:  Regular rate and rhythm. Abdomen:  Soft, nontender and nondistended. Normal bowel sounds, without guarding, and without rebound.   Neurologic:  Alert and   oriented x4;  grossly normal neurologically.  Impression/Plan: Drew Gardner is here for an endoscopy to be performed for Barretts esophagus   And duodenal polyp follow up. Risks, benefits, limitations, and alternatives regarding  endoscopy have been reviewed with the patient.  Questions have been answered.  All parties agreeable.   Gaylyn Cheers, MD  10/06/2017, 10:58 AM

## 2017-10-09 ENCOUNTER — Encounter: Payer: Self-pay | Admitting: Unknown Physician Specialty

## 2017-10-10 LAB — SURGICAL PATHOLOGY

## 2017-10-19 ENCOUNTER — Telehealth: Payer: Self-pay | Admitting: Internal Medicine

## 2017-10-24 ENCOUNTER — Inpatient Hospital Stay: Payer: Medicare Other | Attending: Internal Medicine | Admitting: Internal Medicine

## 2017-10-24 ENCOUNTER — Inpatient Hospital Stay: Payer: Medicare Other

## 2017-10-24 ENCOUNTER — Other Ambulatory Visit: Payer: Self-pay

## 2017-10-24 ENCOUNTER — Encounter: Payer: Self-pay | Admitting: Internal Medicine

## 2017-10-24 VITALS — BP 151/68 | HR 76 | Temp 97.6°F | Resp 18 | Ht 65.0 in | Wt 163.1 lb

## 2017-10-24 DIAGNOSIS — I10 Essential (primary) hypertension: Secondary | ICD-10-CM | POA: Diagnosis not present

## 2017-10-24 DIAGNOSIS — Z8547 Personal history of malignant neoplasm of testis: Secondary | ICD-10-CM

## 2017-10-24 DIAGNOSIS — D473 Essential (hemorrhagic) thrombocythemia: Secondary | ICD-10-CM | POA: Insufficient documentation

## 2017-10-24 DIAGNOSIS — R12 Heartburn: Secondary | ICD-10-CM | POA: Diagnosis not present

## 2017-10-24 DIAGNOSIS — Z923 Personal history of irradiation: Secondary | ICD-10-CM | POA: Insufficient documentation

## 2017-10-24 LAB — CBC WITH DIFFERENTIAL/PLATELET
BASOS ABS: 0.1 10*3/uL (ref 0–0.1)
BASOS PCT: 1 %
EOS PCT: 2 %
Eosinophils Absolute: 0.1 10*3/uL (ref 0–0.7)
HCT: 41.2 % (ref 40.0–52.0)
Hemoglobin: 14.7 g/dL (ref 13.0–18.0)
Lymphocytes Relative: 16 %
Lymphs Abs: 0.9 10*3/uL — ABNORMAL LOW (ref 1.0–3.6)
MCH: 42.3 pg — ABNORMAL HIGH (ref 26.0–34.0)
MCHC: 35.7 g/dL (ref 32.0–36.0)
MCV: 118.7 fL — ABNORMAL HIGH (ref 80.0–100.0)
MONO ABS: 0.7 10*3/uL (ref 0.2–1.0)
Monocytes Relative: 12 %
NEUTROS ABS: 4.1 10*3/uL (ref 1.4–6.5)
Neutrophils Relative %: 69 %
PLATELETS: 408 10*3/uL (ref 150–440)
RBC: 3.47 MIL/uL — ABNORMAL LOW (ref 4.40–5.90)
RDW: 15.2 % — AB (ref 11.5–14.5)
WBC: 5.9 10*3/uL (ref 3.8–10.6)

## 2017-10-24 LAB — COMPREHENSIVE METABOLIC PANEL
ALK PHOS: 48 U/L (ref 38–126)
ALT: 19 U/L (ref 0–44)
ANION GAP: 13 (ref 5–15)
AST: 19 U/L (ref 15–41)
Albumin: 4.5 g/dL (ref 3.5–5.0)
BILIRUBIN TOTAL: 2.7 mg/dL — AB (ref 0.3–1.2)
BUN: 31 mg/dL — AB (ref 8–23)
CALCIUM: 9.6 mg/dL (ref 8.9–10.3)
CO2: 27 mmol/L (ref 22–32)
Chloride: 100 mmol/L (ref 98–111)
Creatinine, Ser: 0.78 mg/dL (ref 0.61–1.24)
GFR calc Af Amer: >60 mL/min (ref >60)
GFR calc non Af Amer: >60 mL/min (ref >60)
Glucose, Bld: 128 mg/dL — ABNORMAL HIGH (ref 70–99)
Potassium: 3.4 mmol/L — ABNORMAL LOW (ref 3.5–5.1)
Sodium: 140 mmol/L (ref 135–145)
TOTAL PROTEIN: 6.8 g/dL (ref 6.5–8.1)

## 2017-10-24 LAB — LACTATE DEHYDROGENASE: LDH: 151 U/L (ref 98–192)

## 2017-10-24 NOTE — Progress Notes (Signed)
[name] OFFICE PROGRESS NOTE  Patient Care Team: Barbaraann Boys, MD as PCP - General (Pediatrics)  Cancer Staging No matching staging information was found for the patient.   Oncology History   # 2012- MACROCYTIC ANEMIA//B12 def/pernicious anemia [N-LDH; hapto-33; Intrinsic factor Antibody-NEG; PNH flow- Neg];  # July 2015- ESSENTIAL THROMBOCYTOSIS- [CALR MUTATION POSITIVE; Dr.Pandit];BMBx- Aug 2015- myeloproliferative disorder[MRI- for low back pain- signal abnormality s/o MPN] July 2016- platelets- 1028K; MAY 2016-  on Hydrea 1000/d except Mon & Fri [500 mg/d]  # Testicular cancer [phone] s/p RT;  No chemo]-   # dependant edema- ? 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  male pleasant patient above history of essential thrombocytosis on Hydrea is here for follow-up.  Denies any new symptoms of shortness of breath or cough.  Denies any strokes.  Chronic mild heartburn on Protonix.  Review of Systems  Constitutional: Negative for chills, diaphoresis, fever, malaise/fatigue and weight loss.  HENT: Negative for nosebleeds and sore throat.   Eyes: Negative for double vision.  Respiratory: Negative for cough, hemoptysis, sputum production, shortness of breath and wheezing.   Cardiovascular: Negative for chest pain, palpitations, orthopnea and leg swelling.  Gastrointestinal: Positive for heartburn. Negative for abdominal pain, blood in stool, constipation, diarrhea, melena, nausea and vomiting.  Genitourinary: Negative for dysuria, frequency and urgency.  Musculoskeletal: Negative for back pain and joint pain.  Skin: Negative.  Negative for itching and rash.  Neurological: Negative for dizziness, tingling, focal weakness, weakness and headaches.  Endo/Heme/Allergies:  Does not bruise/bleed easily.  Psychiatric/Behavioral: Negative for depression. The patient is not nervous/anxious and does not have insomnia.       PAST MEDICAL HISTORY :  Past Medical History:  Diagnosis Date  . Anemia   . Arthritis   . Barrett's esophagus   . Dependent edema    chronic  . Essential thrombocytosis ([place])   . Flow murmur   . GERD (gastroesophageal reflux disease)   . Glaucoma   . Hemochromatosis   . Hypertension   . Testicular cancer [facility])    when patient ws 72 years old    PAST SURGICAL HISTORY :   Past Surgical History:  Procedure Laterality Date  . CHOLECYSTECTOMY    . ESOPHAGOGASTRODUODENOSCOPY (EGD) WITH PROPOFOL N/A 10/06/2017   Procedure: ESOPHAGOGASTRODUODENOSCOPY (EGD) WITH PROPOFOL;  Surgeon: Manya Silvas, MD;  Location: [facility] ENDOSCOPY;  Service: Endoscopy;  Laterality: N/A;  . ORCHIECTOMY Right    when he was 72 years old    FAMILY HISTORY :   Family History  Problem Relation Age of Onset  . Stroke Mother   . Stroke Father     SOCIAL HISTORY:   Social History   Tobacco Use  . Smoking status: Never Smoker  . Smokeless tobacco: Never Used  Substance Use Topics  . Alcohol use: Yes    Alcohol/week: 1.0 standard drinks    Types: 1 Cans of beer per week    Comment: every once a while not an every week event  . Drug use: No    ALLERGIES:  has No Known Allergies.  MEDICATIONS:  Current Outpatient Medications  Medication Sig Dispense Refill  . aspirin 81 MG tablet Take 81 mg by mouth daily.    . brimonidine-timolol (COMBIGAN) 0.2-0.5 %  ophthalmic solution Apply 0.2 drops to eye daily. Both eyes    . furosemide (LASIX) 20 MG tablet Take 1 tablet by mouth daily.    . hydroxyurea (HYDREA) 500 MG capsule TAKE ONE CAPSULE BY MOUTH ON MONDAY AND FRIDAY, AND 2 CAPSULES DAILY THE REST OF THE WEEK 180 capsule 3  . pantoprazole (PROTONIX) 40 MG tablet Take 40 mg by mouth daily.    . potassium chloride (KLOR-CON) 20 MEQ packet Take by mouth 2  (two) times daily.    . sucralfate (CARAFATE) 1 g tablet Take 1 g by mouth 4 (four) times daily -  with meals and at bedtime.    . vitamin B-12 (CYANOCOBALAMIN) 1000 MCG tablet Take 1,000 mcg by mouth daily.     No current facility-administered medications for this visit.     PHYSICAL EXAMINATION: ECOG PERFORMANCE STATUS: 0 - Asymptomatic  BP (!) 151/68 (BP Location: Left Arm, Patient Position: Sitting)   Pulse 76   Temp 97.6 F (36.4 C) (Tympanic)   Resp 18   Ht 5\' 5"  (1.651 m)   Wt 163 lb 2.3 oz (74 kg)   BMI 27.15 kg/m   Filed Weights   10/24/17 0839  Weight: 163 lb 2.3 oz (74 kg)    Physical Exam  Constitutional: He is oriented to person, place, and time and well-developed, well-nourished, and in no distress.  HENT:  Head: Normocephalic and atraumatic.  Mouth/Throat: Oropharynx is clear and moist. No oropharyngeal exudate.  Eyes: Pupils are equal, round, and reactive to light.  Neck: Normal range of motion. Neck supple.  Cardiovascular: Normal rate and regular rhythm.  Pulmonary/Chest: No respiratory distress. He has no wheezes.  Abdominal: Soft. Bowel sounds are normal. He exhibits no distension and no mass. There is no tenderness. There is no rebound and no guarding.  Musculoskeletal: Normal range of motion. He exhibits no edema or tenderness.  Neurological: He is alert and oriented to person, place, and time.  Skin: Skin is warm.  Psychiatric: Affect normal.      LABORATORY DATA:  I have reviewed the data as listed    Component Value Date/Time   NA 140 10/24/2017 0816   K 3.4 (L) 10/24/2017 0816   CL 100 10/24/2017 0816   CO2 27 10/24/2017 0816   GLUCOSE 128 (H) 10/24/2017 0816   BUN 31 (H) 10/24/2017 0816   CREATININE 0.78 10/24/2017 0816   CREATININE 0.90 10/25/2012 1124   CALCIUM 9.6 10/24/2017 0816   PROT 6.8 10/24/2017 0816   PROT 6.7 10/25/2012 1124   ALBUMIN 4.5 10/24/2017 0816   ALBUMIN 4.2 10/25/2012 1124   AST 19 10/24/2017 0816   AST 15  10/25/2012 1124   ALT 19 10/24/2017 0816   ALT 24 10/25/2012 1124   ALKPHOS 48 10/24/2017 0816   ALKPHOS 68 10/25/2012 1124   BILITOT 2.7 (H) 10/24/2017 0816   BILITOT 2.5 (H) 10/25/2012 1124   GFRNONAA >60 10/24/2017 0816   GFRNONAA >60 10/25/2012 1124   GFRAA >60 10/24/2017 0816   GFRAA >60 10/25/2012 1124    No results found for: SPEP, UPEP  Lab Results  Component Value Date   WBC 5.9 10/24/2017   NEUTROABS 4.1 10/24/2017   HGB 14.7 10/24/2017   HCT 41.2 10/24/2017   MCV 118.7 (H) 10/24/2017   PLT 408 10/24/2017      Chemistry      Component Value Date/Time   NA 140 10/24/2017 0816   K 3.4 (L) 10/24/2017 0816   CL 100  10/24/2017 0816   CO2 27 10/24/2017 0816   BUN 31 (H) 10/24/2017 0816   CREATININE 0.78 10/24/2017 0816   CREATININE 0.90 10/25/2012 1124      Component Value Date/Time   CALCIUM 9.6 10/24/2017 0816   ALKPHOS 48 10/24/2017 0816   ALKPHOS 68 10/25/2012 1124   AST 19 10/24/2017 0816   AST 15 10/25/2012 1124   ALT 19 10/24/2017 0816   ALT 24 10/25/2012 1124   BILITOT 2.7 (H) 10/24/2017 0816   BILITOT 2.5 (H) 10/25/2012 1124       RADIOGRAPHIC STUDIES: I have personally reviewed the radiological images as listed and agreed with the findings in the report. No results found.   ASSESSMENT & PLAN:  Essential thrombocythemia ([place]) # ESSENTIAL THROMBOCYTOSIS- Hydrea 1000 milligrams once a day except Monday Friday 500 mg a day. Normal-WBC/Hb- Patient's platelets are 409;on aspirin.  Stable.   # bil LE swelling- sec ? Lymphedema sec to his testicular cancer surgery. STABLE.     #History of pernicious anemia -on B12 stable.  # elevated bili- ? Gilbert syndrome. Monitor for now. STABLE.   # follow up labs 6 months/ labs.   No orders of the defined types were placed in this encounter.  All questions were answered. The patient knows to call the clinic with any problems, questions or concerns.      Cammie Sickle, MD 10/24/2017 8:54 AM

## 2017-10-24 NOTE — Assessment & Plan Note (Addendum)
#   ESSENTIAL THROMBOCYTOSIS- Hydrea 1000 milligrams once a day except Monday Friday 500 mg a day. Normal-WBC/Hb- Patient's platelets are 409;on aspirin.  Stable.   # bil LE swelling- sec ? Lymphedema sec to his testicular cancer surgery. STABLE.     #History of pernicious anemia -on B12 stable.  # elevated bili- ? Gilbert syndrome. Monitor for now. STABLE.   # follow up labs 6 months/ labs.

## 2017-11-08 NOTE — Telephone Encounter (Signed)
x

## 2017-11-16 ENCOUNTER — Telehealth: Payer: Self-pay | Admitting: *Deleted

## 2017-11-16 NOTE — Telephone Encounter (Signed)
-----   Message from Secundino Ginger sent at 11/16/2017 10:43 AM EDT ----- Regarding: recommendation Contact: [phone] Drew Gardner tried to get a supplemental ins and they turned him down because he is on Hydroxyurea. He needs letter stating why he is on this medication because he said he didn't have cancer.Im faxing the letter he reecived from the ins company.

## 2017-11-17 ENCOUNTER — Encounter: Payer: Self-pay | Admitting: *Deleted

## 2017-11-20 NOTE — Telephone Encounter (Signed)
Letter written. Gerald Stabs, RN contacted patient and left msg that letter will be brought to [facility] on Tuesday 9/17 for patient pick up

## 2018-01-09 ENCOUNTER — Other Ambulatory Visit: Payer: Self-pay | Admitting: Pediatrics

## 2018-01-09 ENCOUNTER — Ambulatory Visit
Admission: RE | Admit: 2018-01-09 | Discharge: 2018-01-09 | Disposition: A | Discharge status: Home / Self Care | Payer: Medicare Other | Source: Ambulatory Visit | Attending: Pediatrics | Admitting: Pediatrics

## 2018-01-09 DIAGNOSIS — I700 Atherosclerosis of aorta: Secondary | ICD-10-CM | POA: Diagnosis not present

## 2018-01-09 DIAGNOSIS — R0600 Dyspnea, unspecified: Secondary | ICD-10-CM

## 2018-01-09 DIAGNOSIS — J90 Pleural effusion, not elsewhere classified: Secondary | ICD-10-CM | POA: Insufficient documentation

## 2018-01-13 ENCOUNTER — Other Ambulatory Visit: Payer: Self-pay | Admitting: Internal Medicine

## 2018-01-13 DIAGNOSIS — D473 Essential (hemorrhagic) thrombocythemia: Secondary | ICD-10-CM

## 2018-01-15 NOTE — Telephone Encounter (Signed)
)     Ref Range & Units 22mo ago  WBC 3.8 - 10.6 K/uL 5.9   RBC 4.40 - 5.90 MIL/uL 3.47Low    Hemoglobin 13.0 - 18.0 g/dL 14.7   HCT 40.0 - 52.0 % 41.2   MCV 80.0 - 100.0 fL 118.7High    MCH 26.0 - 34.0 pg 42.3High    MCHC 32.0 - 36.0 g/dL 35.7   RDW 11.5 - 14.5 % 15.2High    Platelets 150 - 440 K/uL 408   Neutrophils Relative % % 69   Neutro Abs 1.4 - 6.5 K/uL 4.1   Lymphocytes Relative % 16   Lymphs Abs 1.0 - 3.6 K/uL 0.9Low    Monocytes Relative % 12   Monocytes Absolute 0.2 - 1.0 K/uL 0.7   Eosinophils Relative % 2   Eosinophils Absolute 0 - 0.7 K/uL 0.1   Basophils Relative % 1   Basophils Absolute 0 - 0.1 K/uL 0.1   Comment: Performed at [facility], [address] [place], [place] [address]  Resulting Agency  [facility] CLIN LAB      Specimen Collected: 10/24/17 08:16  Last Resulted: 10/24/17 08:28

## 2018-04-19 ENCOUNTER — Other Ambulatory Visit: Payer: Self-pay

## 2018-04-19 DIAGNOSIS — D473 Essential (hemorrhagic) thrombocythemia: Secondary | ICD-10-CM

## 2018-04-24 ENCOUNTER — Other Ambulatory Visit: Payer: Self-pay | Admitting: Hematology and Oncology

## 2018-04-24 ENCOUNTER — Encounter: Payer: Self-pay | Admitting: Hematology and Oncology

## 2018-04-24 ENCOUNTER — Ambulatory Visit: Payer: Medicare Other | Admitting: Internal Medicine

## 2018-04-24 ENCOUNTER — Inpatient Hospital Stay: Payer: Medicare Other | Attending: Hematology and Oncology

## 2018-04-24 ENCOUNTER — Inpatient Hospital Stay (HOSPITAL_BASED_OUTPATIENT_CLINIC_OR_DEPARTMENT_OTHER): Payer: Medicare Other | Admitting: Hematology and Oncology

## 2018-04-24 ENCOUNTER — Other Ambulatory Visit: Payer: Medicare Other

## 2018-04-24 VITALS — BP 151/65 | HR 60 | Temp 97.3°F | Resp 16 | Wt 159.2 lb

## 2018-04-24 DIAGNOSIS — Z79899 Other long term (current) drug therapy: Secondary | ICD-10-CM

## 2018-04-24 DIAGNOSIS — Z85820 Personal history of malignant melanoma of skin: Secondary | ICD-10-CM

## 2018-04-24 DIAGNOSIS — D473 Essential (hemorrhagic) thrombocythemia: Secondary | ICD-10-CM

## 2018-04-24 DIAGNOSIS — E538 Deficiency of other specified B group vitamins: Secondary | ICD-10-CM

## 2018-04-24 DIAGNOSIS — M7989 Other specified soft tissue disorders: Secondary | ICD-10-CM | POA: Diagnosis not present

## 2018-04-24 DIAGNOSIS — R5383 Other fatigue: Secondary | ICD-10-CM

## 2018-04-24 DIAGNOSIS — Z8547 Personal history of malignant neoplasm of testis: Secondary | ICD-10-CM | POA: Insufficient documentation

## 2018-04-24 DIAGNOSIS — D510 Vitamin B12 deficiency anemia due to intrinsic factor deficiency: Secondary | ICD-10-CM | POA: Diagnosis not present

## 2018-04-24 DIAGNOSIS — D649 Anemia, unspecified: Secondary | ICD-10-CM

## 2018-04-24 LAB — IRON AND TIBC
Iron: 88 ug/dL (ref 45–182)
Saturation Ratios: 33 % (ref 17.9–39.5)
TIBC: 269 ug/dL (ref 250–450)
UIBC: 181 ug/dL

## 2018-04-24 LAB — FOLATE: Folate: 10.7 ng/mL (ref >5.9)

## 2018-04-24 LAB — COMPREHENSIVE METABOLIC PANEL
ALT: 10 U/L (ref 0–44)
AST: 14 U/L — ABNORMAL LOW (ref 15–41)
Albumin: 4.3 g/dL (ref 3.5–5.0)
Alkaline Phosphatase: 48 U/L (ref 38–126)
Anion gap: 6 (ref 5–15)
BUN: 21 mg/dL (ref 8–23)
CO2: 29 mmol/L (ref 22–32)
Calcium: 9.1 mg/dL (ref 8.9–10.3)
Chloride: 104 mmol/L (ref 98–111)
Creatinine, Ser: 0.71 mg/dL (ref 0.61–1.24)
GFR calc Af Amer: >60 mL/min (ref >60)
GFR calc non Af Amer: >60 mL/min (ref >60)
Glucose, Bld: 103 mg/dL — ABNORMAL HIGH (ref 70–99)
Potassium: 3.9 mmol/L (ref 3.5–5.1)
Sodium: 139 mmol/L (ref 135–145)
Total Bilirubin: 2.8 mg/dL — ABNORMAL HIGH (ref 0.3–1.2)
Total Protein: 6.6 g/dL (ref 6.5–8.1)

## 2018-04-24 LAB — CBC WITH DIFFERENTIAL/PLATELET
Abs Immature Granulocytes: 0.02 10*3/uL (ref 0.00–0.07)
Basophils Absolute: 0.1 10*3/uL (ref 0.0–0.1)
Basophils Relative: 2 %
Eosinophils Absolute: 0.1 10*3/uL (ref 0.0–0.5)
Eosinophils Relative: 2 %
HCT: 34.1 % — ABNORMAL LOW (ref 39.0–52.0)
Hemoglobin: 12.6 g/dL — ABNORMAL LOW (ref 13.0–17.0)
Immature Granulocytes: 1 %
Lymphocytes Relative: 18 %
Lymphs Abs: 0.7 10*3/uL (ref 0.7–4.0)
MCH: 44.4 pg — ABNORMAL HIGH (ref 26.0–34.0)
MCHC: 37 g/dL — ABNORMAL HIGH (ref 30.0–36.0)
MCV: 120.1 fL — ABNORMAL HIGH (ref 80.0–100.0)
Monocytes Absolute: 0.3 10*3/uL (ref 0.1–1.0)
Monocytes Relative: 8 %
Neutro Abs: 2.7 10*3/uL (ref 1.7–7.7)
Neutrophils Relative %: 69 %
Platelets: 320 10*3/uL (ref 150–400)
RBC: 2.84 MIL/uL — ABNORMAL LOW (ref 4.22–5.81)
RDW: 17.9 % — ABNORMAL HIGH (ref 11.5–15.5)
WBC: 3.9 10*3/uL — ABNORMAL LOW (ref 4.0–10.5)
nRBC: 0.5 % — ABNORMAL HIGH (ref 0.0–0.2)

## 2018-04-24 LAB — VITAMIN B12: Vitamin B-12: 733 pg/mL (ref 180–914)

## 2018-04-24 LAB — RETICULOCYTES
Immature Retic Fract: 29.7 % — ABNORMAL HIGH (ref 2.3–15.9)
RBC.: 2.91 MIL/uL — ABNORMAL LOW (ref 4.22–5.81)
Retic Count, Absolute: 156.3 10*3/uL (ref 19.0–186.0)
Retic Ct Pct: 5.4 % — ABNORMAL HIGH (ref 0.4–3.1)

## 2018-04-24 LAB — TSH: TSH: 2.313 u[IU]/mL (ref 0.350–4.500)

## 2018-04-24 LAB — FERRITIN: Ferritin: 316 ng/mL (ref 24–336)

## 2018-04-24 LAB — BILIRUBIN, DIRECT: Bilirubin, Direct: 0.3 mg/dL — ABNORMAL HIGH (ref 0.0–0.2)

## 2018-04-24 LAB — LACTATE DEHYDROGENASE: LDH: 144 U/L (ref 98–192)

## 2018-04-24 NOTE — Progress Notes (Signed)
Pt here for follow up. Previous Dr. B patient. Denies any concerns.

## 2018-04-24 NOTE — Progress Notes (Signed)
[facility]     [address]     [place], [place] [address]     Phone: [phone]      Fax: [phone]        Clinic day:  04/24/2018  Chief Complaint: Drew Gardner is a 73 y.o. 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  HPI:  The patient was diagnosed with macrocytic anemia in 2012.  He was diagnosed with B12 deficiency/pernicious anemia. Normal studies included LDH, haptoglobin, intrinsic factor and PNH testing.  In 09/2013,  He was diagnosed with essential thrombocytosis (ET) by Dr Ma Hillock.  CALR was positive. Bone marrow biopsy in 10/2013 revealed a myeloproliferative disorder.  Lumbar spine MRI on 10/04/2013 revealed extensive marrow signal abnormality worrisome for multiple myeloma or marrow infiltrative process.     Platelet count has been followed: 1,028,000 on 07/01/2014, 885,000 on 07/24/2014, 528,000 on 09/11/2014, 382,000 on 01/15/2015, 347,000 on 04/09/2015, 249,000 on 09/22/2015, 362,000 on 10/25/2016, and 408,000 on 10/24/2017.  In 07/2014 he was on hydroxyurea '1000mg'$ /day except Mondays and Fridays (500 mg/day).  He continues on the current dose.  The patient was last seen by Dr. Rogue Bussing on 10/24/2017.  At that time, he denies any symptoms.  CBC revealed a hematocrit of 41.2, hemoglobin 14.7, MCV 118.7, platelets 408,000, WBC 5900 with an ANC of 4100.  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  He has a history of chronic lower extremity swelling ? Lymphedema secondary to testicular cancer and treatment).  He was diagnosed with testicular cancer in 1977 (no reports available).  He received radiation and no chemotherapy.  The patient has a history of malignant melanoma on the lower back s/p biopsy on 03/05/2018 by Dr. Ree Edman.  Clinical stage was T1bN0Mx (stage IA). Lesion was 0.8 mm thick.  He underwent wide excision on 03/16/2018 by Dr.  Carylon Perches.   He is scheduled for a cardiac MRI.  He has a follow-up tomorrow at [facility].  Symptomatically, he is doing well.  He notes issues with melanoma of the back since Christmas 2019.  He describes getting the flu then pneumonia.  He is feeling much better.   Past Medical History:  Diagnosis Date  . Anemia   . Arthritis   . Barrett's esophagus   . Dependent edema    chronic  . Essential thrombocytosis ([place])   . Flow murmur   . GERD (gastroesophageal reflux disease)   . Glaucoma   . Hemochromatosis   . Hypertension   . Testicular cancer [facility])    when patient ws 73 years old    Past Surgical History:  Procedure Laterality Date  . CHOLECYSTECTOMY    . ESOPHAGOGASTRODUODENOSCOPY (EGD) WITH PROPOFOL N/A 10/06/2017   Procedure: ESOPHAGOGASTRODUODENOSCOPY (EGD) WITH PROPOFOL;  Surgeon: Manya Silvas, MD;  Location: [facility] ENDOSCOPY;  Service: Endoscopy;  Laterality: N/A;  . ORCHIECTOMY Right    when he was 73 years old    Family History  Problem Relation Age of Onset  . Stroke Mother   . Stroke Father     Social History:  reports that he has never smoked. He has never used smokeless tobacco. He reports current alcohol use of about 1.0 standard drinks of alcohol per week. He reports that he does not use drugs.  The patient lives in [place].  The patient is alone today.  Allergies: No Known Allergies  Current Medications: Current Outpatient Medications  Medication Sig Dispense Refill  . aspirin 81 MG tablet Take 81 mg by mouth daily.    . brimonidine-timolol (COMBIGAN) 0.2-0.5 % ophthalmic solution Apply 0.2 drops to eye daily. Both eyes    . chlorhexidine (PERIDEX) 0.12 % solution SWISH WITH 15 ML PO BID AND EXPECTORATE    . furosemide (LASIX) 20 MG tablet Take 1 tablet by mouth daily.    . meloxicam (MOBIC) 15 MG tablet TK 1 T PO D    . pantoprazole (PROTONIX) 40 MG tablet Take 40 mg by mouth daily.    . potassium chloride (KLOR-CON) 20 MEQ packet Take by mouth 2 (two)  times daily.    . sucralfate (CARAFATE) 1 g tablet Take 1 g by mouth 4 (four) times daily -  with meals and at bedtime.    . vitamin B-12 (CYANOCOBALAMIN) 1000 MCG tablet Take 1,000 mcg by mouth daily.    . hydroxyurea (HYDREA) 500 MG capsule TAKE 1 CAPSULE BY MOUTH ON MONDAY AND FRIDAY, AND 2 CAPSULES DAILY THE REST OF THE WEEK. 180 capsule 0  . lisinopril (PRINIVIL,ZESTRIL) 10 MG tablet Take by mouth.     No current facility-administered medications for this visit.     Review of Systems:  GENERAL:  Feels good.  Active.  No fevers, sweats.  Weight previously down to 147lbs, now 159 pounds. PERFORMANCE STATUS (ECOG):  1 HEENT:  No visual changes, runny nose, sore throat, mouth sores or tenderness. Lungs: No shortness of breath or cough.  No hemoptysis. Cardiac:  No chest pain, palpitations, orthopnea, or PND.  Cardiac MRI tomorrow. GI:  No nausea, vomiting, diarrhea, constipation, melena or hematochezia.  He is taking Protonix BID.  Colonoscopy UTD. GU:  No urgency, frequency, dysuria, or hematuria. Musculoskeletal:  No back pain.  No joint pain.  No muscle tenderness. Extremities:  No pain or swelling. Skin:  Recent melanoma of the back. No rashes or skin changes. Neuro:  No headache, numbness or weakness, balance or coordination issues. Endocrine:  No diabetes, thyroid issues, hot flashes or night sweats. Psych:  No mood changes, depression or anxiety. Pain:  No focal pain. Review of systems:  All other systems reviewed and found to be negative.  Physical Exam: Blood pressure (!) 151/65, pulse 60, temperature (!) 97.3 F (36.3 C), temperature source Tympanic, resp. rate 16, weight 159 lb 2.8 oz (72.2 kg), SpO2 100 %. GENERAL:  Well developed, well nourished, gentleman sitting comfortably in the exam room in no acute distress. MENTAL STATUS:  Alert and oriented to person, place and time. HEAD:  Short gray hair.  Normocephalic, atraumatic, face symmetric, no Cushingoid features. EYES:   Glasses.  Blue eyes.  Pupils equal round and reactive to light and accomodation.  No conjunctivitis or scleral icterus. ENT:  Oropharynx clear without lesion.  Tongue normal. Mucous membranes moist.  RESPIRATORY:  Clear to auscultation without rales, wheezes or rhonchi. CARDIOVASCULAR:  Regular rate and rhythm without murmur, rub or gallop. ABDOMEN:  Soft, non-tender, with active bowel sounds, and no hepatosplenomegaly.  No masses. SKIN:  No rashes, ulcers or lesions. EXTREMITIES: Lower extremity edema (right > left).  No skin discoloration or tenderness.  No palpable cords. LYMPH NODES: No palpable cervical, supraclavicular, axillary or inguinal adenopathy  NEUROLOGICAL: Unremarkable. PSYCH:  Appropriate.   Appointment on 04/24/2018  Component Date Value Ref Range Status  . Bilirubin, Direct 04/24/2018 0.3* 0.0 - 0.2 mg/dL Final   Performed at [facility], [address] [place], [place] [address]  .  Folate 04/24/2018 10.7  >5.9 ng/mL Final   Performed at [facility], [address] [place], [place] [address]  . Vitamin B-12 04/24/2018 733  180 - 914 pg/mL Final   Comment: (NOTE) This assay is not validated for testing neonatal or myeloproliferative syndrome specimens for Vitamin B12 levels. Performed at [facility], [place] [address], [place], [place] [address]   . LDH 04/24/2018 144  98 - 192 U/L Final   Performed at [facility], [address], [place], [place] [address]  . Sodium 04/24/2018 139  135 - 145 mmol/L Final  . Potassium 04/24/2018 3.9  3.5 - 5.1 mmol/L Final  . Chloride 04/24/2018 104  98 - 111 mmol/L Final  . CO2 04/24/2018 29  22 - 32 mmol/L Final  . Glucose, Bld 04/24/2018 103* 70 - 99 mg/dL Final  . BUN 04/24/2018 21  8 - 23 mg/dL Final  . Creatinine, Ser 04/24/2018 0.71  0.61 - 1.24 mg/dL Final  . Calcium 04/24/2018 9.1  8.9 - 10.3 mg/dL Final  . Total Protein 04/24/2018 6.6  6.5 - 8.1 g/dL Final  . Albumin  04/24/2018 4.3  3.5 - 5.0 g/dL Final  . AST 04/24/2018 14* 15 - 41 U/L Final  . ALT 04/24/2018 10  0 - 44 U/L Final  . Alkaline Phosphatase 04/24/2018 48  38 - 126 U/L Final  . Total Bilirubin 04/24/2018 2.8* 0.3 - 1.2 mg/dL Final  . GFR calc non Af Amer 04/24/2018 >60  >60 mL/min Final  . GFR calc Af Amer 04/24/2018 >60  >60 mL/min Final  . Anion gap 04/24/2018 6  5 - 15 Final   Performed at [facility], [address], [place], [place] [address]  . WBC 04/24/2018 3.9* 4.0 - 10.5 K/uL Final  . RBC 04/24/2018 2.84* 4.22 - 5.81 MIL/uL Final  . Hemoglobin 04/24/2018 12.6* 13.0 - 17.0 g/dL Final  . HCT 04/24/2018 34.1* 39.0 - 52.0 % Final  . MCV 04/24/2018 120.1* 80.0 - 100.0 fL Final  . MCH 04/24/2018 44.4* 26.0 - 34.0 pg Final  . MCHC 04/24/2018 37.0* 30.0 - 36.0 g/dL Final  . RDW 04/24/2018 17.9* 11.5 - 15.5 % Final  . Platelets 04/24/2018 320  150 - 400 K/uL Final  . nRBC 04/24/2018 0.5* 0.0 - 0.2 % Final  . Neutrophils Relative % 04/24/2018 69  % Final  . Neutro Abs 04/24/2018 2.7  1.7 - 7.7 K/uL Final  . Lymphocytes Relative 04/24/2018 18  % Final  . Lymphs Abs 04/24/2018 0.7  0.7 - 4.0 K/uL Final  . Monocytes Relative 04/24/2018 8  % Final  . Monocytes Absolute 04/24/2018 0.3  0.1 - 1.0 K/uL Final  . Eosinophils Relative 04/24/2018 2  % Final  . Eosinophils Absolute 04/24/2018 0.1  0.0 - 0.5 K/uL Final  . Basophils Relative 04/24/2018 2  % Final  . Basophils Absolute 04/24/2018 0.1  0.0 - 0.1 K/uL Final  . Immature Granulocytes 04/24/2018 1  % Final  . Abs Immature Granulocytes 04/24/2018 0.02  0.00 - 0.07 K/uL Final   Performed at [facility], [address] [place], [place] [address]  . TSH 04/24/2018 2.313  0.350 - 4.500 uIU/mL Final   Comment: Performed by a 3rd Generation assay with a functional sensitivity of <=0.01 uIU/mL. Performed at [facility], [address] [place], [place] [address]   . Iron 04/24/2018 88  45 - 182 ug/dL  Final  . TIBC  04/24/2018 269  250 - 450 ug/dL Final  . Saturation Ratios 04/24/2018 33  17.9 - 39.5 % Final  . UIBC 04/24/2018 181  ug/dL Final   Performed at [facility], [address] [place], [place] [address]  . Ferritin 04/24/2018 316  24 - 336 ng/mL Final   Performed at [facility], [address] [place], [place] [address]  . Retic Ct Pct 04/24/2018 5.4* 0.4 - 3.1 % Final  . RBC. 04/24/2018 2.91* 4.22 - 5.81 MIL/uL Final  . Retic Count, Absolute 04/24/2018 156.3  19.0 - 186.0 K/uL Final  . Immature Retic Fract 04/24/2018 29.7* 2.3 - 15.9 % Final   Performed at [facility], [address], [place], [place] [address]    Assessment:  Drew Gardner is a 73 y.o. 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  Platelet count was 1,028,000 on 07/01/2014.  Bone marrow biopsy in 10/2013 revealed a myeloproliferative disorder.  Lumbar spine MRI on 10/04/2013 revealed extensive marrow signal abnormality worrisome for multiple myeloma or marrow infiltrative process.     He is on hydroxyurea 1000 mg 5 days/week and 500 mg 2 days/week (Mondays and Fridays).  He has B12 deficiency and pernicious anemia. He is on oral B12.  B12 was 894 on 07/24/2014 and 801 on 04/09/2015.  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  He has Gilbert's disease.  He has a history of malignant melanoma on the lower back s/p biopsy on 03/05/2018 followed by wide excision on 03/16/2018.  Clinical stage was T1bN0Mx (stage IA). Lesion was 0.8 mm thick.  He is followed at [facility].  Symptomatically, he is doing well.  He voices no complaints.  Exam reveals no adenopathy of hepatosplenomegaly.  Bilirubin is 2.8 (direct 0.3).  Plan: 1.   Labs today:  CBC with diff, CMP, LDH, direct bilirubin, B12, folate. 2.   Essential thrombocytosis  Review entire medical history, diagnosis and management of essential thrombocytosis.  Hematocrit 34.1.  Hemoglobin 12.6.   MCV 120.1.  Platelets 320,000.  Continue hydroxyurea 1000 mg 5 days/week and 500 mg 2 days/week (Mondays and Fridays).  Macrocytosis likely from hydroxyurea.   Check B12 and folate annually.  Discuss blood counts every 3 months and follow-up in clinic every 6 months. 3.   B12 deficiency  Notes indicate pernicious anemia.  Continue oral B12.  Check B12 and folate annually. 4.   Malignant melanoma  Review of outside records indicates stage IA disease.  Patient followed at [facility].  Anticipate ongoing surveillance. 5.   RTC in 1 month for labs (CBC with diff). 6.   RTC in 3 months for labs (CBC with diff, CMP, LDH). 7.   RTC in 6 months for MD assessment and labs (CBC with diff, CMP, LDH).  I discussed the assessment and treatment plan with the patient.  The patient was provided an opportunity to ask questions and all were answered.  The patient agreed with the plan and demonstrated an understanding of the instructions.  The patient was advised to call back or seek an in person evaluation if the symptoms worsen or if the condition fails to improve as anticipated.    Lequita Asal, MD  04/24/2018, 4:35 PM

## 2018-05-08 ENCOUNTER — Other Ambulatory Visit
Admission: RE | Admit: 2018-05-08 | Discharge: 2018-05-08 | Disposition: A | Discharge status: Home / Self Care | Payer: Medicare Other | Source: Home / Self Care | Attending: Rehabilitative and Restorative Service Providers" | Admitting: Rehabilitative and Restorative Service Providers"

## 2018-05-08 ENCOUNTER — Ambulatory Visit
Admission: RE | Admit: 2018-05-08 | Discharge: 2018-05-08 | Disposition: A | Discharge status: Home / Self Care | Payer: Medicare Other | Source: Ambulatory Visit | Attending: Rehabilitative and Restorative Service Providers" | Admitting: Rehabilitative and Restorative Service Providers"

## 2018-05-08 ENCOUNTER — Other Ambulatory Visit: Payer: Self-pay | Admitting: Cardiology

## 2018-05-08 ENCOUNTER — Ambulatory Visit
Admission: RE | Admit: 2018-05-08 | Discharge: 2018-05-08 | Disposition: A | Discharge status: Home / Self Care | Payer: Medicare Other | Source: Ambulatory Visit | Attending: Student | Admitting: Student

## 2018-05-08 ENCOUNTER — Other Ambulatory Visit: Payer: Self-pay

## 2018-05-08 DIAGNOSIS — J90 Pleural effusion, not elsewhere classified: Secondary | ICD-10-CM | POA: Diagnosis not present

## 2018-05-08 DIAGNOSIS — Z9889 Other specified postprocedural states: Secondary | ICD-10-CM | POA: Diagnosis not present

## 2018-05-08 LAB — BODY FLUID CELL COUNT WITH DIFFERENTIAL
Eos, Fluid: 0 %
LYMPHS FL: 37 %
Monocyte-Macrophage-Serous Fluid: 62 %
Neutrophil Count, Fluid: 1 %
Total Nucleated Cell Count, Fluid: 545 cu mm

## 2018-05-08 LAB — LACTATE DEHYDROGENASE, PLEURAL OR PERITONEAL FLUID: LD, Fluid: 71 U/L — ABNORMAL HIGH (ref 3–23)

## 2018-05-08 LAB — ALBUMIN, PLEURAL OR PERITONEAL FLUID: Albumin, Fluid: 2.6 g/dL

## 2018-05-08 LAB — PROTEIN, PLEURAL OR PERITONEAL FLUID: Total protein, fluid: 3.1 g/dL

## 2018-05-08 LAB — GLUCOSE, PLEURAL OR PERITONEAL FLUID: Glucose, Fluid: 126 mg/dL

## 2018-05-08 LAB — PATHOLOGIST SMEAR REVIEW

## 2018-05-08 NOTE — Procedures (Signed)
PROCEDURE SUMMARY:  Successful US guided diagnostic and therapeutic right thoracentesis. Yielded 750 mL of yellow fluid. Pt tolerated procedure well. No immediate complications.  Specimen was sent for labs. CXR ordered.  EBL < 5 mL  Docia Barrier PA-C 05/08/2018 1:54 PM

## 2018-05-10 LAB — PH, BODY FLUID: pH, Body Fluid: 7.7

## 2018-05-22 ENCOUNTER — Inpatient Hospital Stay: Payer: Medicare Other | Attending: Hematology and Oncology

## 2018-05-22 ENCOUNTER — Other Ambulatory Visit: Payer: Self-pay

## 2018-05-22 DIAGNOSIS — D510 Vitamin B12 deficiency anemia due to intrinsic factor deficiency: Secondary | ICD-10-CM | POA: Diagnosis not present

## 2018-05-22 DIAGNOSIS — D473 Essential (hemorrhagic) thrombocythemia: Secondary | ICD-10-CM | POA: Insufficient documentation

## 2018-05-22 LAB — CBC WITH DIFFERENTIAL/PLATELET
Abs Immature Granulocytes: 0.06 K/uL (ref 0.00–0.07)
Basophils Absolute: 0.1 K/uL (ref 0.0–0.1)
Basophils Relative: 1 %
Eosinophils Absolute: 0.1 K/uL (ref 0.0–0.5)
Eosinophils Relative: 1 %
HCT: 37.9 % — ABNORMAL LOW (ref 39.0–52.0)
Hemoglobin: 14.2 g/dL (ref 13.0–17.0)
Immature Granulocytes: 1 %
Lymphocytes Relative: 16 %
Lymphs Abs: 1 K/uL (ref 0.7–4.0)
MCH: 44.4 pg — ABNORMAL HIGH (ref 26.0–34.0)
MCHC: 37.5 g/dL — ABNORMAL HIGH (ref 30.0–36.0)
MCV: 118.4 fL — ABNORMAL HIGH (ref 80.0–100.0)
Monocytes Absolute: 0.6 K/uL (ref 0.1–1.0)
Monocytes Relative: 10 %
Neutro Abs: 4.1 K/uL (ref 1.7–7.7)
Neutrophils Relative %: 71 %
Platelets: 371 K/uL (ref 150–400)
RBC: 3.2 MIL/uL — ABNORMAL LOW (ref 4.22–5.81)
RDW: 15.3 % (ref 11.5–15.5)
WBC: 5.8 K/uL (ref 4.0–10.5)
nRBC: 0.3 % — ABNORMAL HIGH (ref 0.0–0.2)

## 2018-05-25 ENCOUNTER — Other Ambulatory Visit: Payer: Self-pay | Admitting: Internal Medicine

## 2018-05-25 DIAGNOSIS — D473 Essential (hemorrhagic) thrombocythemia: Secondary | ICD-10-CM

## 2018-05-25 NOTE — Telephone Encounter (Signed)
CBC with Differential/Platelet  Order: [id number]  Status:  Final result  Visible to patient:  No (Not Released)  Next appt:  07/17/2018 at 08:00 AM in Oncology ([facility])  Dx:  Essential thrombocythemia ([place])   Ref Range & Units 3d ago (05/22/18) 6mo ago (04/24/18) 26mo ago (10/24/17) 93yr ago (04/25/17) 14yr ago (10/25/16) 66yr ago (04/12/16) 73yr ago (12/23/15)  WBC 4.0 - 10.5 K/uL 5.8  3.9Low   5.9 R 6.3 R 5.2 R 8.8 R 5.5 R  RBC 4.22 - 5.81 MIL/uL 3.20Low   2.84Low   3.47Low  R 3.59Low  R 3.29Low  R 3.56Low  R 3.41Low  R  Hemoglobin 13.0 - 17.0 g/dL 14.2  12.6Low   14.7 R 15.4 R, CM 14.5 R 15.0 R 14.8 R  HCT 39.0 - 52.0 % 37.9Low   34.1Low   41.2 R 42.2 R 39.4Low  R 41.4 R, CM 40.9 R  MCV 80.0 - 100.0 fL 118.4High   120.1High   118.7High   117.6High   119.6High   116.3High   120.1High    MCH 26.0 - 34.0 pg 44.4High   44.4High   42.3High   42.8High   44.0High   42.0High   43.4High    MCHC 30.0 - 36.0 g/dL 37.5High   37.0High   35.7 R 36.4High  R 36.8High  R 36.2High  R 36.1High  R  RDW 11.5 - 15.5 % 15.3  17.9High   15.2High  R 14.8High  R 14.5 R 15.0High  R 14.9High  R  Platelets 150 - 400 K/uL 371  320  408 R 398 R 362 R 458High  R 348 R  nRBC 0.0 - 0.2 % 0.3High   0.5High         Neutrophils Relative % % 71  69  69  71  69  79  67   Neutro Abs 1.7 - 7.7 K/uL 4.1  2.7  4.1 R 4.5 R 3.5 R 6.9High  R 3.7 R  Lymphocytes Relative % 16  18  16  17  17  10  18    Lymphs Abs 0.7 - 4.0 K/uL 1.0  0.7  0.9Low  R 1.1 R 0.9Low  R 0.9Low  R 1.0 R  Monocytes Relative % 10  8  12  10  12  9  13    Monocytes Absolute 0.1 - 1.0 K/uL 0.6  0.3  0.7 R 0.7 R 0.6 R 0.8 R 0.7 R  Eosinophils Relative % 1  2  2  1  1  1  2    Eosinophils Absolute 0.0 - 0.5 K/uL 0.1  0.1  0.1 R 0.0 R 0.1 R 0.1 R 0.1 R  Basophils Relative % 1  2  1  1  1  1   0   Basophils Absolute 0.0 - 0.1 K/uL 0.1  0.1  0.1 R, CM 0.1 R, CM 0.1 R 0.1 R 0.0 R  Immature Granulocytes % 1  1        Abs Immature Granulocytes 0.00 - 0.07 K/uL 0.06  0.02  CM       Comment: Performed at [facility], [address] [place], [place] [address]  Resulting Agency  [facility] CLIN LAB [place] [facility] [place] [facility] [place] [facility] [place] [facility] [place] [facility] [place] [facility] LAB      Specimen Collected: 05/22/18 13:27  Last Resulted: 05/22/18 14:56

## 2018-07-16 ENCOUNTER — Other Ambulatory Visit: Payer: Self-pay

## 2018-07-17 ENCOUNTER — Inpatient Hospital Stay: Payer: Medicare Other | Attending: Hematology and Oncology

## 2018-07-17 DIAGNOSIS — D473 Essential (hemorrhagic) thrombocythemia: Secondary | ICD-10-CM | POA: Insufficient documentation

## 2018-07-17 DIAGNOSIS — I10 Essential (primary) hypertension: Secondary | ICD-10-CM | POA: Insufficient documentation

## 2018-07-17 LAB — COMPREHENSIVE METABOLIC PANEL
ALT: 14 U/L (ref 0–44)
AST: 15 U/L (ref 15–41)
Albumin: 4.3 g/dL (ref 3.5–5.0)
Alkaline Phosphatase: 48 U/L (ref 38–126)
Anion gap: 7 (ref 5–15)
BUN: 18 mg/dL (ref 8–23)
CO2: 26 mmol/L (ref 22–32)
Calcium: 9 mg/dL (ref 8.9–10.3)
Chloride: 104 mmol/L (ref 98–111)
Creatinine, Ser: 0.74 mg/dL (ref 0.61–1.24)
GFR calc Af Amer: >60 mL/min (ref >60)
GFR calc non Af Amer: >60 mL/min (ref >60)
Glucose, Bld: 97 mg/dL (ref 70–99)
Potassium: 3.6 mmol/L (ref 3.5–5.1)
Sodium: 137 mmol/L (ref 135–145)
Total Bilirubin: 3.6 mg/dL — ABNORMAL HIGH (ref 0.3–1.2)
Total Protein: 6.3 g/dL — ABNORMAL LOW (ref 6.5–8.1)

## 2018-07-17 LAB — CBC WITH DIFFERENTIAL/PLATELET
Abs Immature Granulocytes: 0.04 10*3/uL (ref 0.00–0.07)
Basophils Absolute: 0.1 10*3/uL (ref 0.0–0.1)
Basophils Relative: 1 %
Eosinophils Absolute: 0.1 10*3/uL (ref 0.0–0.5)
Eosinophils Relative: 1 %
HCT: 36.8 % — ABNORMAL LOW (ref 39.0–52.0)
Hemoglobin: 14 g/dL (ref 13.0–17.0)
Immature Granulocytes: 1 %
Lymphocytes Relative: 15 %
Lymphs Abs: 0.6 10*3/uL — ABNORMAL LOW (ref 0.7–4.0)
MCH: 44.9 pg — ABNORMAL HIGH (ref 26.0–34.0)
MCHC: 38 g/dL — ABNORMAL HIGH (ref 30.0–36.0)
MCV: 117.9 fL — ABNORMAL HIGH (ref 80.0–100.0)
Monocytes Absolute: 0.6 10*3/uL (ref 0.1–1.0)
Monocytes Relative: 16 %
Neutro Abs: 2.5 10*3/uL (ref 1.7–7.7)
Neutrophils Relative %: 66 %
Platelets: 336 10*3/uL (ref 150–400)
RBC: 3.12 MIL/uL — ABNORMAL LOW (ref 4.22–5.81)
RDW: 15.1 % (ref 11.5–15.5)
WBC: 3.9 10*3/uL — ABNORMAL LOW (ref 4.0–10.5)
nRBC: 0 % (ref 0.0–0.2)

## 2018-07-17 LAB — LACTATE DEHYDROGENASE: LDH: 164 U/L (ref 98–192)

## 2018-07-18 ENCOUNTER — Telehealth: Payer: Self-pay

## 2018-07-18 ENCOUNTER — Other Ambulatory Visit
Admission: RE | Admit: 2018-07-18 | Discharge: 2018-07-18 | Disposition: A | Discharge status: Home / Self Care | Payer: Medicare Other | Source: Ambulatory Visit | Attending: Rehabilitative and Restorative Service Providers" | Admitting: Rehabilitative and Restorative Service Providers"

## 2018-07-18 DIAGNOSIS — J90 Pleural effusion, not elsewhere classified: Secondary | ICD-10-CM | POA: Insufficient documentation

## 2018-07-18 DIAGNOSIS — R0602 Shortness of breath: Secondary | ICD-10-CM | POA: Diagnosis present

## 2018-07-18 DIAGNOSIS — I10 Essential (primary) hypertension: Secondary | ICD-10-CM | POA: Diagnosis present

## 2018-07-18 LAB — BRAIN NATRIURETIC PEPTIDE: B Natriuretic Peptide: 53 pg/mL (ref 0.0–100.0)

## 2018-07-18 NOTE — Telephone Encounter (Signed)
-----   Message from Lequita Asal, MD sent at 07/17/2018  2:17 PM EDT ----- Regarding: Please call patient with lab results  ----- Message ----- From: Interface, Lab In [place] Sent: 07/17/2018   8:48 AM EDT To: Lequita Asal, MD

## 2018-07-18 NOTE — Telephone Encounter (Signed)
Spoke with the patient to inform him of his lab results. The patient was understanding and agreeable

## 2018-09-19 ENCOUNTER — Other Ambulatory Visit: Payer: Self-pay | Admitting: Internal Medicine

## 2018-09-19 DIAGNOSIS — D473 Essential (hemorrhagic) thrombocythemia: Secondary | ICD-10-CM

## 2018-09-19 NOTE — Telephone Encounter (Signed)
CBC with Differential/Platelet Order: [id number] Status:  Final result Visible to patient:  No (not released) Next appt:  10/23/2018 at 08:00 AM in Oncology ([facility]) Dx:  Gilbert's disease; Essential thromboc...  Ref Range & Units 65mo ago (07/17/18) 76mo ago (05/22/18) 55mo ago (04/24/18)  WBC 4.0 - 10.5 K/uL 3.9Low   5.8  3.9Low    RBC 4.22 - 5.81 MIL/uL 3.12Low   3.20Low   2.84Low    Hemoglobin 13.0 - 17.0 g/dL 14.0  14.2  12.6Low    HCT 39.0 - 52.0 % 36.8Low   37.9Low   34.1Low    MCV 80.0 - 100.0 fL 117.9High   118.4High   120.1High    MCH 26.0 - 34.0 pg 44.9High   44.4High   44.4High    MCHC 30.0 - 36.0 g/dL 38.0High   37.5High   37.0High    RDW 11.5 - 15.5 % 15.1  15.3  17.9High    Platelets 150 - 400 K/uL 336  371  320   nRBC 0.0 - 0.2 % 0.0  0.3High   0.5High    Neutrophils Relative % % 66  71  69   Neutro Abs 1.7 - 7.7 K/uL 2.5  4.1  2.7   Lymphocytes Relative % 15  16  18    Lymphs Abs 0.7 - 4.0 K/uL 0.6Low   1.0  0.7   Monocytes Relative % 16  10  8    Monocytes Absolute 0.1 - 1.0 K/uL 0.6  0.6  0.3   Eosinophils Relative % 1  1  2    Eosinophils Absolute 0.0 - 0.5 K/uL 0.1  0.1  0.1   Basophils Relative % 1  1  2    Basophils Absolute 0.0 - 0.1 K/uL 0.1  0.1  0.1   Immature Granulocytes % 1  1  1    Abs Immature Granulocytes 0.00 - 0.07 K/uL 0.04  0.06 CM  0.02 CM   Comment: Performed at [facility], [address], [place], [place] [address]  Resulting Agency  [facility] CLIN LAB [facility] CLIN LAB [facility] CL[facility] LAB      Specimen Collected: 07/17/18 08:04 Last Resulted: 07/17/18 11:52     Lab Flowsheet   Order Details   View Encounter   Lab and Collection Details   Routing   Result History     CM=Additional comments      Other Results from 07/17/2018  Follow-up Communication for Lactate dehydrogenase   07/18/2018 Telephone     Lactate dehydrogenase Order: [id number]  Status:  Final result Visible to patient:  No (not released) Next appt:  10/23/2018  at 08:00 AM in Oncology ([facility]) Dx:  Gilbert's disease; Essential thromboc...  Ref Range & Units 75mo ago 81mo ago 66mo ago  LDH 98 - 192 U/L 164  144 CM  151 CM   Comment: Performed at [facility], [address], [place], [place] [address]  Resulting Agency  [facility] CLIN LAB [facility] CLIN LAB [facility] CLIN LAB      Specimen Collected: 07/17/18 08:04 Last Resulted: 07/17/18 08:48     Lab Flowsheet   Order Details   View Encounter   Lab and Collection Details   Routing   Result History     CM=Additional comments        Follow-up Communication for Comprehensive metabolic panel   3/50/0938 Telephone     Contains abnormal data Comprehensive metabolic panel Order: [id number]  Status:  Final result Visible to patient:  No (  not released) Next appt:  10/23/2018 at 08:00 AM in Oncology ([facility]) Dx:  Gilbert's disease; Essential thromboc...  Ref Range & Units 16mo ago 13mo ago 46mo ago  Sodium 135 - 145 mmol/L 137  139  140   Potassium 3.5 - 5.1 mmol/L 3.6  3.9  3.4Low    Chloride 98 - 111 mmol/L 104  104  100   CO2 22 - 32 mmol/L 26  29  27    Glucose, Bld 70 - 99 mg/dL 97  103High   128High    BUN 8 - 23 mg/dL 18  21  31High    Creatinine, Ser 0.61 - 1.24 mg/dL 0.74  0.71  0.78   Calcium 8.9 - 10.3 mg/dL 9.0  9.1  9.6   Total Protein 6.5 - 8.1 g/dL 6.3Low   6.6  6.8   Albumin 3.5 - 5.0 g/dL 4.3  4.3  4.5   AST 15 - 41 U/L 15  14Low   19   ALT 0 - 44 U/L 14  10  19    Alkaline Phosphatase 38 - 126 U/L 48  48  48   Total Bilirubin 0.3 - 1.2 mg/dL 3.6High   2.8High   2.7High    GFR calc non Af Amer >60 mL/min >60  >60  >60   GFR calc Af Amer >60 mL/min >60  >60  >60 CM   Anion gap 5 - 15 7  6  CM  13 CM   Comment: Performed at [facility], [address], [place], [place] [address]  Resulting Agency  [facility] CLIN LAB [facility] CLIN LAB [facility] CLIN LAB      Specimen Collected: 07/17/18 08:04 Last Resulted: 07/17/18 08:48

## 2018-10-22 ENCOUNTER — Other Ambulatory Visit: Payer: Self-pay

## 2018-10-23 ENCOUNTER — Inpatient Hospital Stay: Payer: Medicare Other

## 2018-10-23 ENCOUNTER — Other Ambulatory Visit: Payer: Self-pay

## 2018-10-23 ENCOUNTER — Inpatient Hospital Stay: Payer: Medicare Other | Attending: Hematology and Oncology | Admitting: Oncology

## 2018-10-23 ENCOUNTER — Other Ambulatory Visit: Payer: Self-pay | Admitting: Hematology and Oncology

## 2018-10-23 VITALS — BP 163/68 | HR 76 | Temp 97.3°F | Resp 20 | Wt 166.7 lb

## 2018-10-23 DIAGNOSIS — R17 Unspecified jaundice: Secondary | ICD-10-CM

## 2018-10-23 DIAGNOSIS — D473 Essential (hemorrhagic) thrombocythemia: Secondary | ICD-10-CM | POA: Diagnosis present

## 2018-10-23 DIAGNOSIS — D510 Vitamin B12 deficiency anemia due to intrinsic factor deficiency: Secondary | ICD-10-CM | POA: Diagnosis not present

## 2018-10-23 DIAGNOSIS — Z79899 Other long term (current) drug therapy: Secondary | ICD-10-CM | POA: Insufficient documentation

## 2018-10-23 DIAGNOSIS — I10 Essential (primary) hypertension: Secondary | ICD-10-CM | POA: Insufficient documentation

## 2018-10-23 DIAGNOSIS — C4359 Malignant melanoma of other part of trunk: Secondary | ICD-10-CM | POA: Insufficient documentation

## 2018-10-23 DIAGNOSIS — J90 Pleural effusion, not elsewhere classified: Secondary | ICD-10-CM

## 2018-10-23 LAB — CBC WITH DIFFERENTIAL/PLATELET
Basophils Absolute: 0 10*3/uL (ref 0.0–0.1)
Basophils Relative: 1 %
Eosinophils Absolute: 0 10*3/uL (ref 0.0–0.5)
Eosinophils Relative: 1 %
HCT: 38.6 % — ABNORMAL LOW (ref 39.0–52.0)
Lymphocytes Relative: 16 %
Lymphs Abs: 0.7 10*3/uL (ref 0.7–4.0)
MCV: 113.2 fL — ABNORMAL HIGH (ref 80.0–100.0)
Monocytes Absolute: 0.6 10*3/uL (ref 0.1–1.0)
Monocytes Relative: 14 %
Neutro Abs: 3 10*3/uL (ref 1.7–7.7)
Neutrophils Relative %: 68 %
Platelets: 402 10*3/uL — ABNORMAL HIGH (ref 150–400)
RBC: 3.41 MIL/uL — ABNORMAL LOW (ref 4.22–5.81)
RDW: 14.3 % (ref 11.5–15.5)
WBC: 4.4 10*3/uL (ref 4.0–10.5)
nRBC: 0 % (ref 0.0–0.2)

## 2018-10-23 LAB — COMPREHENSIVE METABOLIC PANEL
ALT: 14 U/L (ref 0–44)
AST: 13 U/L — ABNORMAL LOW (ref 15–41)
Albumin: 4.3 g/dL (ref 3.5–5.0)
Alkaline Phosphatase: 49 U/L (ref 38–126)
Anion gap: 6 (ref 5–15)
BUN: 16 mg/dL (ref 8–23)
CO2: 26 mmol/L (ref 22–32)
Calcium: 9.1 mg/dL (ref 8.9–10.3)
Chloride: 105 mmol/L (ref 98–111)
Creatinine, Ser: 0.65 mg/dL (ref 0.61–1.24)
GFR calc Af Amer: >60 mL/min (ref >60)
GFR calc non Af Amer: >60 mL/min (ref >60)
Glucose, Bld: 112 mg/dL — ABNORMAL HIGH (ref 70–99)
Potassium: 4.1 mmol/L (ref 3.5–5.1)
Sodium: 137 mmol/L (ref 135–145)
Total Bilirubin: 4.1 mg/dL — ABNORMAL HIGH (ref 0.3–1.2)
Total Protein: 6.3 g/dL — ABNORMAL LOW (ref 6.5–8.1)

## 2018-10-23 LAB — BILIRUBIN, DIRECT: Bilirubin, Direct: 0.2 mg/dL (ref 0.0–0.2)

## 2018-10-23 LAB — LACTATE DEHYDROGENASE: LDH: 145 U/L (ref 98–192)

## 2018-10-23 NOTE — Progress Notes (Signed)
Patient here for follow up.  No complaints.

## 2018-10-23 NOTE — Progress Notes (Signed)
[facility]     [address], [address]     [place], [place] [address]     Phone: [phone]      Fax: [phone]        Clinic day:  04/24/2018  Chief Complaint: Drew Gardner is a 73 y.o. 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  HPI:  The patient was diagnosed with macrocytic anemia in 2012.  He was diagnosed with B12 deficiency/pernicious anemia. Normal studies included LDH, haptoglobin, intrinsic factor and PNH testing.  In 09/2013,  He was diagnosed with essential thrombocytosis (ET) by Dr Ma Hillock.  CALR was positive. Bone marrow biopsy in 10/2013 revealed a myeloproliferative disorder.  Lumbar spine MRI on 10/04/2013 revealed extensive marrow signal abnormality worrisome for multiple myeloma or marrow infiltrative process.     Platelet count has been followed: 1,028,000 on 07/01/2014, 885,000 on 07/24/2014, 528,000 on 09/11/2014, 382,000 on 01/15/2015, 347,000 on 04/09/2015, 249,000 on 09/22/2015, 362,000 on 10/25/2016, 408,000 on 10/24/2017, 371,000 on 05/22/18 and 336,000 on 07/17/18.  In 07/2014 he was on hydroxyurea 1019m/day except Mondays and Fridays (500 mg/day).  He continues on the current dose.  The patient was last seen by Dr. CMike Gipon 04/14/2018.  At that time, he stated some issues with melanoma (stage 1a) on his back followed by [facility].  He admitted to recently getting over the flu and pneumonia.  He was feeling much better.  Hematocrit was 34.1, hemoglobin 12.6, MCV 120.1, platelets 320,000, WBC 3900 with an ANC of 2700.  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  He has a history of chronic lower extremity swelling ? Lymphedema secondary to testicular cancer and treatment).  He was diagnosed with testicular cancer in 1977 (no reports available).  He received radiation and no chemotherapy.  The patient has a history of malignant melanoma on the lower back s/p  biopsy on 03/05/2018 by Dr. ARee Edman  Clinical stage was T1bN0Mx (stage IA). Lesion was 0.8 mm thick.  He underwent wide excision on 03/16/2018 by Dr. PCarylon Perches   He is scheduled for a cardiac MRI.   Symptomatically, he is doing well. Notes wide local excision of melanoma to his medial lumbar back by Dr. MLazarus Gowdaon 04/25/18 with negative margins.  He has occasional shortness of breath with exertion but otherwise feels great.    Past Medical History:  Diagnosis Date  . Anemia   . Arthritis   . Barrett's esophagus   . Dependent edema    chronic  . Essential thrombocytosis ([place]   . Flow murmur   . GERD (gastroesophageal reflux disease)   . Glaucoma   . Hemochromatosis   . Hypertension   . Testicular cancer ([facility]    when patient ws 73years old    Past Surgical History:  Procedure Laterality Date  . CHOLECYSTECTOMY    . ESOPHAGOGASTRODUODENOSCOPY (EGD) WITH PROPOFOL N/A 10/06/2017   Procedure: ESOPHAGOGASTRODUODENOSCOPY (EGD) WITH PROPOFOL;  Surgeon: EManya Silvas MD;  [facility];  Service: Endoscopy;  Laterality: N/A;  . ORCHIECTOMY Right    when he was 73years old    Family History  Problem Relation Age of Onset  . Stroke Mother   . Stroke Father     Social History:  reports that he has never smoked. He has never used smokeless tobacco. He reports current alcohol use of about 1.0 standard drinks of alcohol per week. He reports  that he does not use drugs.  The patient lives in [place].  The patient is alone today.  Allergies: No Known Allergies  Current Medications: Current Outpatient Medications  Medication Sig Dispense Refill  . aspirin 81 MG tablet Take 81 mg by mouth daily.    . brimonidine-timolol (COMBIGAN) 0.2-0.5 % ophthalmic solution Apply 0.2 drops to eye daily. Both eyes    . chlorhexidine (PERIDEX) 0.12 % solution SWISH WITH 15 ML PO BID AND EXPECTORATE    . furosemide (LASIX) 20 MG tablet Take 1 tablet by mouth daily.    .  hydroxyurea (HYDREA) 500 MG capsule TAKE ONE CAPSULE BY MOUTH MONDAY AND FRIDAY, AND 2 CAPSULES DAILY THE REST OF THE WEEK 180 capsule 0  . lisinopril (PRINIVIL,ZESTRIL) 10 MG tablet Take by mouth.    . meloxicam (MOBIC) 15 MG tablet TK 1 T PO D    . pantoprazole (PROTONIX) 40 MG tablet Take 40 mg by mouth daily.    . vitamin B-12 (CYANOCOBALAMIN) 1000 MCG tablet Take 1,000 mcg by mouth daily.    . potassium chloride (KLOR-CON) 20 MEQ packet Take by mouth 2 (two) times daily.    . sucralfate (CARAFATE) 1 g tablet Take 1 g by mouth 4 (four) times daily -  with meals and at bedtime.     No current facility-administered medications for this visit.     Review of Systems:  Review of Systems  Constitutional: Negative.  Negative for chills, fever, malaise/fatigue and weight loss.  HENT: Negative for congestion, ear pain and tinnitus.   Eyes: Negative.  Negative for blurred vision and double vision.  Respiratory: Positive for shortness of breath (With exertion). Negative for cough and sputum production.   Cardiovascular: Positive for leg swelling (Chronic). Negative for chest pain and palpitations.  Gastrointestinal: Negative.  Negative for abdominal pain, constipation, diarrhea, nausea and vomiting.  Genitourinary: Negative for dysuria, frequency and urgency.  Musculoskeletal: Negative for back pain and falls.  Skin: Negative.  Negative for rash.  Neurological: Negative.  Negative for weakness and headaches.  Endo/Heme/Allergies: Negative.  Does not bruise/bleed easily.  Psychiatric/Behavioral: Negative.  Negative for depression. The patient is not nervous/anxious and does not have insomnia.     Physical Exam: Physical Exam Constitutional:      Appearance: Normal appearance.  HENT:     Head: Normocephalic and atraumatic.  Eyes:     Pupils: Pupils are equal, round, and reactive to light.  Neck:     Musculoskeletal: Normal range of motion.  Cardiovascular:     Rate and Rhythm: Normal  rate and regular rhythm.     Heart sounds: Normal heart sounds. No murmur.  Pulmonary:     Effort: Pulmonary effort is normal.     Breath sounds: Normal breath sounds. No wheezing.  Abdominal:     General: Bowel sounds are normal. There is no distension.     Palpations: Abdomen is soft.     Tenderness: There is no abdominal tenderness.  Musculoskeletal: Normal range of motion.        General: Swelling present.  Skin:    General: Skin is warm and dry.     Findings: No rash.  Neurological:     Mental Status: He is alert and oriented to person, place, and time.  Psychiatric:        Judgment: Judgment normal.    Assessment:  Drew Gardner is a 73 y.o. 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  Platelet count was 1,028,000 on 07/01/2014.  Bone marrow biopsy in 10/2013 revealed a myeloproliferative disorder.  Lumbar spine MRI on 10/04/2013 revealed extensive marrow signal abnormality worrisome for multiple myeloma or marrow infiltrative process.     He is on hydroxyurea 1000 mg 5 days/week and 500 mg 2 days/week (Mondays and Fridays).  He has B12 deficiency and pernicious anemia. He is on oral B12.  B12 was 894 on 07/24/2014 and 801 on 04/09/2015.  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  He has Gilbert's disease.  He has a history of malignant melanoma on the lower back s/p biopsy on 03/05/2018 followed by wide excision on 03/16/2018.  Clinical stage was T1bN0Mx (stage IA). Lesion was 0.8 mm thick.  He is followed at [facility].  Symptomatically, he is doing well.  He offers no complaints today.  Exam reveals no adenopathy or hepatosplenomegaly.  Mild bilateral lower extremity nonpitting edema.  Total bilirubin is 4.1.   He voices no complaints.  Exam reveals no adenopathy of hepatosplenomegaly.  Total bilirubin is 2.8 (direct 0.3).  Plan: 1.   Labs today:  CBC with diff, CMP, LDH.   2.   Essential thrombocytosis  Reviewed previous note and  trending lab work.  Hematocrit 38.6.  Hemoglobin; pending  MCV 113.2, platelets 402,000.  Continue hydroxyurea 1000 mg 5 days/week and 500 mg 2 days/week (Mondays and Fridays).  Macrocytosis likely from hydroxyurea.   Check B12 and folate annually (last on 04/24/18)  RTC every 3 months and follow-up in clinic every 6 months. 3.   B12 deficiency  Notes indicate pernicious anemia.  Continue oral B12.  Check B12 and folate annually. 4.   Malignant melanoma  Recent wide local excision medial lumbar back on 04/25/18 with negative margins.   Patient followed at [facility] by Dr. Lazarus Gowda.   Anticipate ongoing surveillance. 5.   RTC in 1 month for labs (CBC with diff, CMP, direct bilirubin, LDH).   6.   RTC in 3 months for labs (CBC with diff, CMP, LDH). 7.   RTC in 6 months for MD assessment and labs (CBC with diff, CMP, LDH, folate, vitamin b12).  I discussed the assessment and treatment plan with the patient.  The patient was provided an opportunity to ask questions and all were answered.  The patient agreed with the plan and demonstrated an understanding of the instructions.  The patient was advised to call back or seek an in person evaluation if the symptoms worsen or if the condition fails to improve as anticipated.   Faythe Casa, NP 10/23/2018 10:12 AM

## 2018-11-20 ENCOUNTER — Inpatient Hospital Stay: Payer: Medicare Other | Attending: Hematology and Oncology

## 2018-11-20 ENCOUNTER — Other Ambulatory Visit: Payer: Self-pay

## 2018-11-20 DIAGNOSIS — D473 Essential (hemorrhagic) thrombocythemia: Secondary | ICD-10-CM | POA: Diagnosis present

## 2018-11-20 LAB — CBC WITH DIFFERENTIAL/PLATELET
Abs Immature Granulocytes: 0.04 10*3/uL (ref 0.00–0.07)
Basophils Absolute: 0.1 10*3/uL (ref 0.0–0.1)
Basophils Relative: 1 %
Eosinophils Absolute: 0.1 10*3/uL (ref 0.0–0.5)
Eosinophils Relative: 2 %
HCT: 38.9 % — ABNORMAL LOW (ref 39.0–52.0)
Immature Granulocytes: 1 %
Lymphocytes Relative: 14 %
Lymphs Abs: 0.6 10*3/uL — ABNORMAL LOW (ref 0.7–4.0)
MCV: 112.4 fL — ABNORMAL HIGH (ref 80.0–100.0)
Monocytes Absolute: 0.5 10*3/uL (ref 0.1–1.0)
Monocytes Relative: 12 %
Neutro Abs: 3.1 10*3/uL (ref 1.7–7.7)
Neutrophils Relative %: 70 %
Platelets: 403 10*3/uL — ABNORMAL HIGH (ref 150–400)
RBC: 3.46 MIL/uL — ABNORMAL LOW (ref 4.22–5.81)
RDW: 14.8 % (ref 11.5–15.5)
WBC: 4.3 10*3/uL (ref 4.0–10.5)
nRBC: 0 % (ref 0.0–0.2)

## 2018-11-20 LAB — COMPREHENSIVE METABOLIC PANEL
ALT: 17 U/L (ref 0–44)
AST: 15 U/L (ref 15–41)
Albumin: 4.4 g/dL (ref 3.5–5.0)
Alkaline Phosphatase: 48 U/L (ref 38–126)
Anion gap: 8 (ref 5–15)
BUN: 23 mg/dL (ref 8–23)
CO2: 25 mmol/L (ref 22–32)
Calcium: 9.1 mg/dL (ref 8.9–10.3)
Chloride: 106 mmol/L (ref 98–111)
Creatinine, Ser: 0.72 mg/dL (ref 0.61–1.24)
GFR calc Af Amer: >60 mL/min (ref >60)
GFR calc non Af Amer: >60 mL/min (ref >60)
Glucose, Bld: 107 mg/dL — ABNORMAL HIGH (ref 70–99)
Potassium: 4.1 mmol/L (ref 3.5–5.1)
Sodium: 139 mmol/L (ref 135–145)
Total Bilirubin: 3.1 mg/dL — ABNORMAL HIGH (ref 0.3–1.2)
Total Protein: 6.6 g/dL (ref 6.5–8.1)

## 2018-11-20 LAB — LACTATE DEHYDROGENASE: LDH: 143 U/L (ref 98–192)

## 2018-11-20 LAB — BILIRUBIN, DIRECT: Bilirubin, Direct: 0.2 mg/dL (ref 0.0–0.2)

## 2019-01-21 ENCOUNTER — Other Ambulatory Visit: Payer: Self-pay | Admitting: Hematology and Oncology

## 2019-01-21 ENCOUNTER — Other Ambulatory Visit: Payer: Self-pay

## 2019-01-21 DIAGNOSIS — D473 Essential (hemorrhagic) thrombocythemia: Secondary | ICD-10-CM

## 2019-01-21 NOTE — Telephone Encounter (Signed)
CBC with Differential/Platelet Order: [id number] Status:  Final result  Visible to patient:  No (not released)  Next appt:  01/22/2019 at 08:00 AM in Oncology ([facility])  Dx:  Essential thrombocythemia ([place])  Ref Range & Units 69mo ago  WBC 4.0 - 10.5 K/uL 4.3   RBC 4.22 - 5.81 MIL/uL 3.46Low    Hemoglobin 13.0 - 17.0 g/dL RESULTS UNAVAILABLE DUE TO INTERFERING SUBSTANCE   HCT 39.0 - 52.0 % 38.9Low    MCV 80.0 - 100.0 fL 112.4High    MCH 26.0 - 34.0 pg RESULTS UNAVAILABLE DUE TO INTERFERING SUBSTANCE   MCHC 30.0 - 36.0 g/dL RESULTS UNAVAILABLE DUE TO INTERFERING SUBSTANCE   RDW 11.5 - 15.5 % 14.8   Platelets 150 - 400 K/uL 403High    nRBC 0.0 - 0.2 % 0.0   Neutrophils Relative % % 70   Neutro Abs 1.7 - 7.7 K/uL 3.1   Lymphocytes Relative % 14   Lymphs Abs 0.7 - 4.0 K/uL 0.6Low    Monocytes Relative % 12   Monocytes Absolute 0.1 - 1.0 K/uL 0.5   Eosinophils Relative % 2   Eosinophils Absolute 0.0 - 0.5 K/uL 0.1   Basophils Relative % 1   Basophils Absolute 0.0 - 0.1 K/uL 0.1   Immature Granulocytes % 1   Abs Immature Granulocytes 0.00 - 0.07 K/uL 0.04   Comment: Performed at [facility] [place], [address], [place], [place] [address]  Resulting Agency  [facility]      Specimen Collected: 11/20/18 08:15  Last Resulted: 11/20/18 09:11     Lab Flowsheet   Order Details   View Encounter   Lab and Collection Details   Routing   Result History         Other Results from 11/20/2018  Bilirubin, direct Order: [id number]  Status:  Final result  Visible to patient:  No (not released)  Next appt:  01/22/2019 at 08:00 AM in Oncology ([facility])  Dx:  Essential thrombocythemia ([place])  Ref Range & Units 25mo ago  Bilirubin, Direct 0.0 - 0.2 mg/dL 0.2   Comment: Performed at [facility], [address], [place], [place] [address]  Resulting Agency  [facility] LAB      Specimen Collected: 11/20/18 08:15  Last Resulted: 11/20/18 08:38

## 2019-01-22 ENCOUNTER — Inpatient Hospital Stay: Payer: Medicare Other | Attending: Hematology and Oncology

## 2019-01-22 ENCOUNTER — Telehealth: Payer: Self-pay

## 2019-01-22 DIAGNOSIS — D473 Essential (hemorrhagic) thrombocythemia: Secondary | ICD-10-CM | POA: Diagnosis not present

## 2019-01-22 LAB — COMPREHENSIVE METABOLIC PANEL
ALT: 17 U/L (ref 0–44)
AST: 13 U/L — ABNORMAL LOW (ref 15–41)
Albumin: 4.5 g/dL (ref 3.5–5.0)
Alkaline Phosphatase: 47 U/L (ref 38–126)
Anion gap: 8 (ref 5–15)
BUN: 22 mg/dL (ref 8–23)
CO2: 27 mmol/L (ref 22–32)
Calcium: 9.2 mg/dL (ref 8.9–10.3)
Chloride: 102 mmol/L (ref 98–111)
Creatinine, Ser: 0.79 mg/dL (ref 0.61–1.24)
GFR calc Af Amer: >60 mL/min (ref >60)
GFR calc non Af Amer: >60 mL/min (ref >60)
Glucose, Bld: 112 mg/dL — ABNORMAL HIGH (ref 70–99)
Potassium: 4.4 mmol/L (ref 3.5–5.1)
Sodium: 137 mmol/L (ref 135–145)
Total Bilirubin: 3.7 mg/dL — ABNORMAL HIGH (ref 0.3–1.2)
Total Protein: 6.5 g/dL (ref 6.5–8.1)

## 2019-01-22 LAB — CBC WITH DIFFERENTIAL/PLATELET
Abs Immature Granulocytes: 0.02 10*3/uL (ref 0.00–0.07)
Basophils Absolute: 0 10*3/uL (ref 0.0–0.1)
Basophils Relative: 1 %
Eosinophils Absolute: 0.1 10*3/uL (ref 0.0–0.5)
Eosinophils Relative: 1 %
HCT: 38.2 % — ABNORMAL LOW (ref 39.0–52.0)
Immature Granulocytes: 1 %
Lymphocytes Relative: 16 %
Lymphs Abs: 0.6 10*3/uL — ABNORMAL LOW (ref 0.7–4.0)
MCV: 116.8 fL — ABNORMAL HIGH (ref 80.0–100.0)
Monocytes Absolute: 0.5 10*3/uL (ref 0.1–1.0)
Monocytes Relative: 12 %
Neutro Abs: 2.8 10*3/uL (ref 1.7–7.7)
Neutrophils Relative %: 69 %
Platelets: 303 10*3/uL (ref 150–400)
RBC: 3.27 MIL/uL — ABNORMAL LOW (ref 4.22–5.81)
RDW: 14.6 % (ref 11.5–15.5)
WBC: 4 10*3/uL (ref 4.0–10.5)
nRBC: 0 % (ref 0.0–0.2)

## 2019-01-22 LAB — LACTATE DEHYDROGENASE: LDH: 142 U/L (ref 98–192)

## 2019-01-22 NOTE — Telephone Encounter (Signed)
Spoke with the patient to inform him that his Hydrea has been refilled on 01/21/2019. The patient has been made aware.

## 2019-01-22 NOTE — Telephone Encounter (Signed)
-----   Message from Secundino Ginger sent at 01/22/2019  8:03 AM EST ----- Drew Gardner only has 3 Hydroxyurea left and he said he's been having trouble with [facility] [place] Hydroxyurea

## 2019-04-18 NOTE — Progress Notes (Signed)
[facility]  [address] [place], [place] [address] Phone: [phone]  Fax: [phone]   Clinic Day:  04/23/2019  Referring physician: Barbaraann Boys, MD  Chief Complaint: Drew Gardner is a 74 y.o. 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  HPI: The patient was last seen in the hematology clinic on 04/24/2018 as a new patient. At that time, he was doing well.  He voiced no complaints.  Exam revealed no adenopathy of hepatosplenomegaly. Patient continued hydroxyurea 1,000 mg 5 days/week and 500 mg 2 days/week. He remained on oral B12.   Labs revealed a hematocrit 34.1, hemoglobin 12.6, MCV 120.1, platelets 320,000, WBC 3,900. Ferritin was 316 with and iron saturation of 33% and a TIBC of 269. AST was 14. Bilirubin was 2.8 (direct 0.3). Retic was 5.4%.  Vitamin B12 was 733 and folate 10.7. LDH was 144. TSH was 2.313.   Patient was seen by Dr. Ubaldo Glassing on 05/08/2018. He had chronic shortness of breath on exertion. He denied chest pain.  Patient was directed to continue his current mediations. Utrasound guided diagnostic and therapeutic right thoracentesis on 05/08/2018 yielded 750 ml of yellow fluid.  Cytospin of fluid revealed mostly small lymphocytes, some macrophages and occasional mesothelial cells.  No malignant cells were seen.  He was seen in the hematology clinic by Faythe Casa, NP on 10/23/2018. At that time, he voiced no complaints.  Exam revealed no adenopathy of hepatosplenomegaly.   Labs followed: 05/22/2018: Hematocrit 37.9, hemoglobin 14.2, MCV 118.4, platelets 371,000, WBC 5,800.  07/17/2018: Hematocrit 36.8, hemoglobin 14.0, MCV 117.9, platelets 336,000, WBC 3,900. Total bilirubin 3.6. LDH 164.  10/23/2018: Hematocrit 38.6, hemoglobin  -----, MCV 113.2, platelets 402,000, WBC 4,400. Total bilirubin 4.1 (direct 0.2). LDH 145.  11/20/2018: Hematocrit 38.9, hemoglobin  -----, MCV 112.4, platelets  403,000, WBC 4,300. Total bilirubin 3.1 (direct 0.2). LDH 143.  01/22/2019: Hematocrit 38.2, hemoglobin  -----, MCV 116.8, platelets 303,000, WBC 4,000. Total bilirubin 3.7. LDH 142.   During the interim, he has felt "ok".  He notes side effect from the COVID-19 vaccine; he described swelling on his arm and nausea. He will receive the second dose on 05/09/2019.   He is taking oral B12 1,000 mcg a day. He remains on hydroxyurea 1 tablet on Monday and Friday, and 2 tablets daily for the rest of the week. The patient denies any new medications or herbal products.   He notes that his weight fluctuates. He notes when he weighs about 165 pound, he wheezes. He denies shortness od breath. He is currently not active secondary to the COVID-19 pandemic. He remains on Protonix BID. He had the melanoma removed from his back and reports no issues. He sometimes notices yellow eyes (scleral icterus).     Past Medical History:  Diagnosis Date  . Anemia   . Arthritis   . Barrett's esophagus   . Dependent edema    chronic  . Essential thrombocytosis ([place])   . Flow murmur   . GERD (gastroesophageal reflux disease)   . Glaucoma   . Hemochromatosis   . Hypertension   . Testicular cancer [facility])    when patient ws 74 years old    Past Surgical History:  Procedure Laterality Date  . CHOLECYSTECTOMY    . ESOPHAGOGASTRODUODENOSCOPY (EGD) WITH PROPOFOL N/A 10/06/2017   Procedure: ESOPHAGOGASTRODUODENOSCOPY (EGD) WITH PROPOFOL;  Surgeon: Manya Silvas, MD;  Location: [facility] ENDOSCOPY;  Service: Endoscopy;  Laterality: N/A;  .  ORCHIECTOMY Right    when he was 74 years old    Family History  Problem Relation Age of Onset  . Stroke Mother   . Stroke Father     Social History:  reports that he has never smoked. He has never used smokeless tobacco. He reports current alcohol use of about 1.0 standard drinks of alcohol per week. He reports that he does not use drugs. The patient lives in [place]. The patient is  alone today.  Allergies: No Known Allergies  Current Medications: Current Outpatient Medications  Medication Sig Dispense Refill  . aspirin 81 MG tablet Take 81 mg by mouth daily.    . brimonidine-timolol (COMBIGAN) 0.2-0.5 % ophthalmic solution Apply 0.2 drops to eye daily. Both eyes    . furosemide (LASIX) 20 MG tablet Take 1 tablet by mouth daily.    . hydroxyurea (HYDREA) 500 MG capsule TAKE 1 CAPSULE BY MOUTH MONDAY AND FRIDAY AND 2 CAPSULES DAILY THE REST OF THE WEEK 180 capsule 0  . lisinopril (PRINIVIL,ZESTRIL) 10 MG tablet Take 10 mg by mouth daily.     . meloxicam (MOBIC) 15 MG tablet TK 1 T PO D    . pantoprazole (PROTONIX) 40 MG tablet Take 40 mg by mouth daily.    . potassium chloride (KLOR-CON) 20 MEQ packet Take 20 mEq by mouth daily.     . sucralfate (CARAFATE) 1 g tablet Take 1 g by mouth 4 (four) times daily -  with meals and at bedtime.    . vitamin B-12 (CYANOCOBALAMIN) 1000 MCG tablet Take 1,000 mcg by mouth daily.     No current facility-administered medications for this visit.    Review of Systems  Constitutional: Positive for weight loss (1 lb; fluctuates). Negative for chills, diaphoresis, fever and malaise/fatigue.       Feels "ok".  Not active secondary to COVID-19.  HENT: Negative.  Negative for congestion, ear pain, hearing loss, nosebleeds, sinus pain and sore throat.   Eyes: Negative for blurred vision, double vision and photophobia.       Occasional yellow eyes.  Respiratory: Negative.  Negative for cough, hemoptysis, sputum production and shortness of breath.   Cardiovascular: Negative.  Negative for chest pain, palpitations and leg swelling.  Gastrointestinal: Negative.  Negative for abdominal pain, blood in stool, constipation, diarrhea, heartburn (on Protonix), melena, nausea and vomiting.       Colonoscopy UTD.  Genitourinary: Negative.  Negative for dysuria, frequency, hematuria and urgency.  Musculoskeletal: Negative.  Negative for back pain,  joint pain, myalgias and neck pain.  Skin: Negative.  Negative for itching and rash.  Neurological: Negative.  Negative for dizziness, tingling, sensory change, speech change, focal weakness, weakness and headaches.  Endo/Heme/Allergies: Negative.  Does not bruise/bleed easily.  Psychiatric/Behavioral: Negative.  Negative for depression and memory loss. The patient is not nervous/anxious and does not have insomnia.   All other systems reviewed and are negative.   Performance status (ECOG):  0  Vitals Blood pressure 138/66, pulse 70, temperature 98.1 F (36.7 C), temperature source Oral, resp. rate 18, weight 165 lb 5.5 oz (75 kg), SpO2 100 %.   Physical Exam  Constitutional: He is oriented to person, place, and time. He appears well-developed and well-nourished. No distress.  HENT:  Head: Normocephalic and atraumatic.  Mouth/Throat: Oropharynx is clear and moist. No oropharyngeal exudate.  Cap.  Short gray hair.  Mask.  Eyes: Pupils are equal, round, and reactive to light. Conjunctivae and EOM are normal. Scleral icterus (  slight) is present.  Glasses. Blue eyes.  Neck: No JVD present.  Cardiovascular: Normal rate, regular rhythm and normal heart sounds. Exam reveals no gallop.  No murmur heard. Pulmonary/Chest: Effort normal and breath sounds normal. No respiratory distress. He has no wheezes. He has no rales. He exhibits no tenderness.  Abdominal: Soft. Bowel sounds are normal. He exhibits no distension and no mass. There is no abdominal tenderness. There is no rebound and no guarding.  Musculoskeletal:        General: Edema (lower extremity; R>L) present. No tenderness. Normal range of motion.     Cervical back: Normal range of motion and neck supple.  Lymphadenopathy:       Head (right side): No preauricular, no posterior auricular and no occipital adenopathy present.       Head (left side): No preauricular, no posterior auricular and no occipital adenopathy present.    He has no  cervical adenopathy.    He has no axillary adenopathy.       Right: No inguinal and no supraclavicular adenopathy present.       Left: No inguinal and no supraclavicular adenopathy present.  Neurological: He is alert and oriented to person, place, and time.  Skin: Skin is warm and dry. No rash noted. He is not diaphoretic. No erythema. No pallor.  Psychiatric: He has a normal mood and affect. His behavior is normal. Judgment and thought content normal.  Nursing note and vitals reviewed.   Appointment on 04/23/2019  Component Date Value Ref Range Status  . WBC 04/23/2019 6.7  4.0 - 10.5 K/uL Final  . RBC 04/23/2019 3.66* 4.22 - 5.81 MIL/uL Final  . Hemoglobin 04/23/2019 15.5  13.0 - 17.0 g/dL Final  . HCT 04/23/2019 41.8  39.0 - 52.0 % Final  . MCV 04/23/2019 114.2* 80.0 - 100.0 fL Final  . MCH 04/23/2019 42.3* 26.0 - 34.0 pg Final  . MCHC 04/23/2019 37.1* 30.0 - 36.0 g/dL Final  . RDW 04/23/2019 15.1  11.5 - 15.5 % Final  . Platelets 04/23/2019 392  150 - 400 K/uL Final  . nRBC 04/23/2019 0.0  0.0 - 0.2 % Final   Performed at [facility], [address], [place], [place] [address]  . Neutrophils Relative % 04/23/2019 PENDING  % Incomplete  . Neutro Abs 04/23/2019 PENDING  1.7 - 7.7 K/uL Incomplete  . Band Neutrophils 04/23/2019 PENDING  % Incomplete  . Lymphocytes Relative 04/23/2019 PENDING  % Incomplete  . Lymphs Abs 04/23/2019 PENDING  0.7 - 4.0 K/uL Incomplete  . Monocytes Relative 04/23/2019 PENDING  % Incomplete  . Monocytes Absolute 04/23/2019 PENDING  0.1 - 1.0 K/uL Incomplete  . Eosinophils Relative 04/23/2019 PENDING  % Incomplete  . Eosinophils Absolute 04/23/2019 PENDING  0.0 - 0.5 K/uL Incomplete  . Basophils Relative 04/23/2019 PENDING  % Incomplete  . Basophils Absolute 04/23/2019 PENDING  0.0 - 0.1 K/uL Incomplete  . WBC Morphology 04/23/2019 PENDING   Incomplete  . RBC Morphology 04/23/2019 PENDING   Incomplete  . Smear Review 04/23/2019 PENDING    Incomplete  . Other 04/23/2019 PENDING  % Incomplete  . nRBC 04/23/2019 PENDING  0 /100 WBC Incomplete  . Metamyelocytes Relative 04/23/2019 PENDING  % Incomplete  . Myelocytes 04/23/2019 PENDING  % Incomplete  . Promyelocytes Relative 04/23/2019 PENDING  % Incomplete  . Blasts 04/23/2019 PENDING  % Incomplete  . Immature Granulocytes 04/23/2019 PENDING  % Incomplete  . Abs Immature Granulocytes 04/23/2019 PENDING  0.00 - 0.07 K/uL  Incomplete    Assessment:  Drew Gardner is a 74 y.o. 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  Platelet count was 1,028,000 on 07/01/2014.  Bone marrow biopsy in 10/2013 revealed a myeloproliferative disorder.  Lumbar spine MRI on 10/04/2013 revealed extensive marrow signal abnormality worrisome for multiple myeloma or marrow infiltrative process.     He is on hydroxyurea 1000 mg 5 days/week and 500 mg 2 days/week (Mondays and Fridays).  He has B12 deficiency and pernicious anemia. He is on oral B12.  B12 was 894 on 07/24/2014, 801 on 04/09/2015, and 887 on 04/23/2019.  Folate was 10.1 on 04/23/2019.  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  He has Gilbert's disease.  He has a history of malignant melanoma on the lower back s/p biopsy on 03/05/2018 followed by wide excision on 03/16/2018.  Clinical stage was T1bN0Mx (stage IA). Lesion was 0.8 mm thick.  He is followed at [facility].  He received the COVID-19 vaccine (second dose on 05/09/2019).  Symptomatically, he is doing well.  Exam reveals no adenopathy or hepatosplenomegaly.   Plan: 1.   Labs today: CBC with diff, CMP, direct bilirubin, retic, LDH, B12, folate, [place].  2.   Peripheral smear for path review. 3.   Essential thrombocytosis             Clinically, he appears to be doing well.             Hematocrit 41.8.  Hemoglobin 15.5.  MCV 114.2.  Platelets 392,000.             Patient on hydroxyurea 1000 mg 5 days/week and 500 mg 2 days/week (Mondays and  Fridays).             Macrocytosis is felt likely from hydroxyurea.                          Continue to check folate and B12 annually.  Review 3 interval CBCs with an interfering substance precluding measurement of hemoglobin.   Contact lab today- unknown protein.   Plan to check SPEP at next labs draw.  3.   B12 deficiency             He has pernicious anemia.             Patient remains on oral B12.             Check B12 and folate annually. 4.   RTC in 3 months for labs (CBC with diff, CMP, SPEP). 5.   RTC in 6 months for MD assessment and labs CBC with diff, CMP, LDH).   I discussed the assessment and treatment plan with the patient.  The patient was provided an opportunity to ask questions and all were answered.  The patient agreed with the plan and demonstrated an understanding of the instructions.  The patient was advised to call back if the symptoms worsen or if the condition fails to improve as anticipated.   Lequita Asal, MD, PhD    04/23/2019, 8:32 AM  I, Selena Batten, am acting as scribe for [employer]. Mike Gip, MD, PhD.  I, Laree Garron C. Mike Gip, MD, have reviewed the above documentation for accuracy and completeness, and I agree with the above.

## 2019-04-22 ENCOUNTER — Other Ambulatory Visit: Payer: Self-pay

## 2019-04-22 DIAGNOSIS — D473 Essential (hemorrhagic) thrombocythemia: Secondary | ICD-10-CM

## 2019-04-22 DIAGNOSIS — E538 Deficiency of other specified B group vitamins: Secondary | ICD-10-CM

## 2019-04-22 DIAGNOSIS — R17 Unspecified jaundice: Secondary | ICD-10-CM

## 2019-04-22 DIAGNOSIS — D649 Anemia, unspecified: Secondary | ICD-10-CM

## 2019-04-22 NOTE — Progress Notes (Signed)
Confirmed Name and DOB. Denies any concerns.  

## 2019-04-23 ENCOUNTER — Inpatient Hospital Stay: Payer: Medicare Other

## 2019-04-23 ENCOUNTER — Inpatient Hospital Stay: Payer: Medicare Other | Attending: Hematology and Oncology | Admitting: Hematology and Oncology

## 2019-04-23 ENCOUNTER — Other Ambulatory Visit: Payer: Self-pay | Admitting: Hematology and Oncology

## 2019-04-23 ENCOUNTER — Encounter: Payer: Self-pay | Admitting: Hematology and Oncology

## 2019-04-23 VITALS — BP 138/66 | HR 70 | Temp 98.1°F | Resp 18 | Wt 165.3 lb

## 2019-04-23 DIAGNOSIS — I10 Essential (primary) hypertension: Secondary | ICD-10-CM | POA: Insufficient documentation

## 2019-04-23 DIAGNOSIS — D473 Essential (hemorrhagic) thrombocythemia: Secondary | ICD-10-CM | POA: Insufficient documentation

## 2019-04-23 DIAGNOSIS — Z79899 Other long term (current) drug therapy: Secondary | ICD-10-CM | POA: Insufficient documentation

## 2019-04-23 DIAGNOSIS — Z85820 Personal history of malignant melanoma of skin: Secondary | ICD-10-CM | POA: Insufficient documentation

## 2019-04-23 DIAGNOSIS — D649 Anemia, unspecified: Secondary | ICD-10-CM

## 2019-04-23 DIAGNOSIS — I351 Nonrheumatic aortic (valve) insufficiency: Secondary | ICD-10-CM | POA: Insufficient documentation

## 2019-04-23 DIAGNOSIS — Z923 Personal history of irradiation: Secondary | ICD-10-CM | POA: Diagnosis not present

## 2019-04-23 DIAGNOSIS — D7589 Other specified diseases of blood and blood-forming organs: Secondary | ICD-10-CM | POA: Insufficient documentation

## 2019-04-23 DIAGNOSIS — Z8547 Personal history of malignant neoplasm of testis: Secondary | ICD-10-CM | POA: Insufficient documentation

## 2019-04-23 DIAGNOSIS — R779 Abnormality of plasma protein, unspecified: Secondary | ICD-10-CM | POA: Diagnosis not present

## 2019-04-23 DIAGNOSIS — D510 Vitamin B12 deficiency anemia due to intrinsic factor deficiency: Secondary | ICD-10-CM | POA: Insufficient documentation

## 2019-04-23 DIAGNOSIS — E538 Deficiency of other specified B group vitamins: Secondary | ICD-10-CM | POA: Diagnosis not present

## 2019-04-23 DIAGNOSIS — R5383 Other fatigue: Secondary | ICD-10-CM | POA: Insufficient documentation

## 2019-04-23 LAB — CBC WITH DIFFERENTIAL/PLATELET
Abs Immature Granulocytes: 0.09 10*3/uL — ABNORMAL HIGH (ref 0.00–0.07)
Basophils Absolute: 0.1 10*3/uL (ref 0.0–0.1)
Basophils Relative: 1 %
Eosinophils Absolute: 0.1 10*3/uL (ref 0.0–0.5)
Eosinophils Relative: 1 %
HCT: 41.8 % (ref 39.0–52.0)
Hemoglobin: 15.5 g/dL (ref 13.0–17.0)
Immature Granulocytes: 1 %
Lymphocytes Relative: 10 %
Lymphs Abs: 0.7 10*3/uL (ref 0.7–4.0)
MCH: 42.3 pg — ABNORMAL HIGH (ref 26.0–34.0)
MCHC: 37.1 g/dL — ABNORMAL HIGH (ref 30.0–36.0)
MCV: 114.2 fL — ABNORMAL HIGH (ref 80.0–100.0)
Monocytes Absolute: 0.5 10*3/uL (ref 0.1–1.0)
Monocytes Relative: 8 %
Neutro Abs: 5.3 10*3/uL (ref 1.7–7.7)
Neutrophils Relative %: 79 %
Platelets: 392 10*3/uL (ref 150–400)
RBC: 3.66 MIL/uL — ABNORMAL LOW (ref 4.22–5.81)
RDW: 15.1 % (ref 11.5–15.5)
WBC: 6.7 10*3/uL (ref 4.0–10.5)
nRBC: 0 % (ref 0.0–0.2)

## 2019-04-23 LAB — RETICULOCYTES
Immature Retic Fract: 31.6 % — ABNORMAL HIGH (ref 2.3–15.9)
RBC.: 3.62 MIL/uL — ABNORMAL LOW (ref 4.22–5.81)
Retic Count, Absolute: 373.9 10*3/uL — ABNORMAL HIGH (ref 19.0–186.0)
Retic Ct Pct: 10.3 % — ABNORMAL HIGH (ref 0.4–3.1)

## 2019-04-23 LAB — COMPREHENSIVE METABOLIC PANEL
ALT: 20 U/L (ref 0–44)
AST: 15 U/L (ref 15–41)
Albumin: 4.8 g/dL (ref 3.5–5.0)
Alkaline Phosphatase: 51 U/L (ref 38–126)
Anion gap: 7 (ref 5–15)
BUN: 23 mg/dL (ref 8–23)
CO2: 29 mmol/L (ref 22–32)
Calcium: 9.4 mg/dL (ref 8.9–10.3)
Chloride: 100 mmol/L (ref 98–111)
Creatinine, Ser: 0.75 mg/dL (ref 0.61–1.24)
GFR calc Af Amer: >60 mL/min (ref >60)
GFR calc non Af Amer: >60 mL/min (ref >60)
Glucose, Bld: 98 mg/dL (ref 70–99)
Potassium: 4.3 mmol/L (ref 3.5–5.1)
Sodium: 136 mmol/L (ref 135–145)
Total Bilirubin: 3.2 mg/dL — ABNORMAL HIGH (ref 0.3–1.2)
Total Protein: 7 g/dL (ref 6.5–8.1)

## 2019-04-23 LAB — LACTATE DEHYDROGENASE: LDH: 195 U/L — ABNORMAL HIGH (ref 98–192)

## 2019-04-23 LAB — BILIRUBIN, DIRECT: Bilirubin, Direct: 0.2 mg/dL (ref 0.0–0.2)

## 2019-04-23 LAB — FOLATE: Folate: 10.1 ng/mL (ref >5.9)

## 2019-04-23 LAB — DAT, POLYSPECIFIC AHG (ARMC ONLY): Polyspecific AHG test: NEGATIVE

## 2019-04-23 LAB — PATHOLOGIST SMEAR REVIEW

## 2019-04-23 LAB — VITAMIN B12: Vitamin B-12: 887 pg/mL (ref 180–914)

## 2019-04-24 LAB — HAPTOGLOBIN: Haptoglobin: <10 mg/dL — ABNORMAL LOW (ref 34–355)

## 2019-04-30 NOTE — Progress Notes (Signed)
[facility]  [address] [place], [place] [address] Phone: [phone]  Fax: [phone]   Clinic Day:  05/03/2019  Referring physician: Barbaraann Boys, MD  Chief Complaint: Drew Gardner is a 74 y.o. 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   HPI: The patient was last seen in the hematology clinic on 04/23/2019 for 1 year assessment.  At that time, he was doing well. Exam revealed no adenopathy or hepatosplenomegaly. He continued on hydroxyurea 1,000 mcg 5 days/week and 500 mg 2 days/week. He remained on oral B12.    At last visit, he was noted to have 3 interval CBCs with an interfering substance precluding measurement of hemoglobin.  The lab was contacted and felt this represented an abnormal protein.  Labs on 04/23/2019 revealed a hematocrit 41.8, hemoglobin 15.5, MCV 114.2, platelets 392,000, WBC 4,300. Haptoglobin was <10.  Total bilirubin 3.2 (direct 0.2). Rect was 10.3%. Vitamin B12 was 887 with folate 10.1. LDH was 195. Coombs was negative.   Peripheral smear revealed macrocytosis with circulating nRBCs. There were large platelets. There was no evidence of circulating blasts.   During the interim, he has felt "ok".  He tires easily (not new).  He denies being on any new medications or herbal products except one of his blood pressure medications. He is on amlodipine and lisinopril (1-10% associated with hemolytic anemia). He has a leaky heart valve (aortic insufficiency). He feels anxious about seeing her granddaughter because he does not get to see her much. His BP was 165/71 today in the clinic, he reports taking the BP medication prior to visit.   He states that he was jaundiced at birth.  He was on folic acid years ago when he was younger.   At times, he noticed his urine was dark.  He underwent cholecystectomy for gallstones at age 32.  He is  unaware of a family history of any blood disorders.  His mother underwent cholecystectomy around the time that he underwent cholecystectomy.  PNH by flow cytometry was negative in 2012 (no report available).  He has a history of hemochromatosis.  Testing was performed in [place].  He initially underwent frequent phlebotomy, but has not in some time.  Ferritin has been followed: 248 on 10/25/2012, 251 on 01/17/2013, 400 on 04/11/2013, 373 on 07/24/2014, 313 on 04/09/2015, 316 on 04/24/2018 and 172 on 04/23/2019.   Past Medical History:  Diagnosis Date  . Anemia   . Arthritis   . Barrett's esophagus   . Dependent edema    chronic  . Essential thrombocytosis ([place])   . Flow murmur   . GERD (gastroesophageal reflux disease)   . Glaucoma   . Hemochromatosis   . Hypertension   . Testicular cancer [facility])    when patient ws 35 years old    Past Surgical History:  Procedure Laterality Date  . CHOLECYSTECTOMY    . ESOPHAGOGASTRODUODENOSCOPY (EGD) WITH PROPOFOL N/A 10/06/2017   Procedure: ESOPHAGOGASTRODUODENOSCOPY (EGD) WITH PROPOFOL;  Surgeon: Manya Silvas, MD;  Location: [facility] ENDOSCOPY;  Service: Endoscopy;  Laterality: N/A;  . ORCHIECTOMY Right    when he was 74 years old    Family History  Problem Relation Age of Onset  . Stroke Mother   . Stroke Father     Social History:  reports that he has never smoked. He has never used smokeless tobacco. He reports current alcohol use of about 1.0  standard drinks of alcohol per week. He reports that he does not use drugs. He has no family history of any blood disorders. The patient lives in [place]. The patient is alone today.  Allergies: No Known Allergies  Current Medications: Current Outpatient Medications  Medication Sig Dispense Refill  . amLODipine (NORVASC) 5 MG tablet Take 5 mg by mouth daily.    Marland Kitchen aspirin 81 MG tablet Take 81 mg by mouth daily.    . furosemide (LASIX) 20 MG tablet Take 1 tablet by mouth daily.    .  hydroxyurea (HYDREA) 500 MG capsule TAKE 1 CAPSULE BY MOUTH MONDAY AND FRIDAY AND 2 CAPSULES DAILY THE REST OF THE WEEK 180 capsule 0  . lisinopril (PRINIVIL,ZESTRIL) 10 MG tablet Take 10 mg by mouth daily.     . pantoprazole (PROTONIX) 40 MG tablet Take 40 mg by mouth daily.    . potassium chloride (KLOR-CON) 20 MEQ packet Take 20 mEq by mouth daily.     . sucralfate (CARAFATE) 1 g tablet Take 1 g by mouth 4 (four) times daily -  with meals and at bedtime.    . vitamin B-12 (CYANOCOBALAMIN) 1000 MCG tablet Take 1,000 mcg by mouth daily.    . brimonidine-timolol (COMBIGAN) 0.2-0.5 % ophthalmic solution Apply 0.2 drops to eye daily. Both eyes    . meloxicam (MOBIC) 15 MG tablet daily as needed.      No current facility-administered medications for this visit.    Review of Systems  Constitutional: Positive for malaise/fatigue. Negative for chills, diaphoresis, fever and weight loss (up 2 lbs; fluctuates).       Doing "ok".  HENT: Negative.  Negative for congestion, ear pain, hearing loss, nosebleeds, sinus pain and sore throat.   Eyes: Negative for blurred vision, double vision and photophobia.       Occasional yellow eyes.  Respiratory: Negative.  Negative for cough, hemoptysis, sputum production and shortness of breath.   Cardiovascular: Negative.  Negative for chest pain, palpitations and leg swelling.       Leaky heart valve.  Gastrointestinal: Negative.  Negative for abdominal pain, blood in stool, constipation, diarrhea, heartburn (on Protonix), melena, nausea and vomiting.       Colonoscopy UTD.  Genitourinary: Negative.  Negative for dysuria, frequency, hematuria and urgency.       Occasional dark urine.  Musculoskeletal: Negative.  Negative for back pain, joint pain, myalgias and neck pain.  Skin: Negative.  Negative for itching and rash.  Neurological: Negative.  Negative for dizziness, tingling, sensory change, speech change, focal weakness, weakness and headaches.    Endo/Heme/Allergies: Negative.  Does not bruise/bleed easily.  Psychiatric/Behavioral: Positive for suicidal ideas. Negative for depression and memory loss. The patient is nervous/anxious (visiting his granddaughter today). The patient does not have insomnia.   All other systems reviewed and are negative.  Performance status (ECOG): 0  Vitals Blood pressure (!) 165/71, pulse 74, temperature 97.6 F (36.4 C), temperature source Tympanic, resp. rate 18, height '5\' 5"'$  (1.651 m), weight 167 lb 1.7 oz (75.8 kg), SpO2 100 %.   Physical Exam  Constitutional: He appears well-developed and well-nourished. No distress.  HENT:  Head: Normocephalic and atraumatic.  Cap.  Short gray hair.  Mask.  Eyes: Pupils are equal, round, and reactive to light. Conjunctivae and EOM are normal. Scleral icterus (slight) is present.  Glasses.  Blue eyes.  Skin: He is not diaphoretic.  Psychiatric: He has a normal mood and affect. His behavior is normal. Judgment and  thought content normal.  Nursing note and vitals reviewed.   Appointment on 05/03/2019  Component Date Value Ref Range Status  . WBC 05/03/2019 4.2  4.0 - 10.5 K/uL Final  . RBC 05/03/2019 3.31* 4.22 - 5.81 MIL/uL Final  . Hemoglobin 05/03/2019 14.1  13.0 - 17.0 g/dL Final  . HCT 05/03/2019 37.7* 39.0 - 52.0 % Final  . MCV 05/03/2019 113.9* 80.0 - 100.0 fL Final  . MCH 05/03/2019 42.6* 26.0 - 34.0 pg Final  . MCHC 05/03/2019 37.4* 30.0 - 36.0 g/dL Final  . RDW 05/03/2019 17.1* 11.5 - 15.5 % Final  . Platelets 05/03/2019 330  150 - 400 K/uL Final  . nRBC 05/03/2019 0.0  0.0 - 0.2 % Final   Performed at [facility], [address] [place], [place] [address]  . Neutrophils Relative % 05/03/2019 PENDING  % Incomplete  . Neutro Abs 05/03/2019 PENDING  1.7 - 7.7 K/uL Incomplete  . Band Neutrophils 05/03/2019 PENDING  % Incomplete  . Lymphocytes Relative 05/03/2019 PENDING  % Incomplete  . Lymphs Abs 05/03/2019 PENDING  0.7 - 4.0 K/uL  Incomplete  . Monocytes Relative 05/03/2019 PENDING  % Incomplete  . Monocytes Absolute 05/03/2019 PENDING  0.1 - 1.0 K/uL Incomplete  . Eosinophils Relative 05/03/2019 PENDING  % Incomplete  . Eosinophils Absolute 05/03/2019 PENDING  0.0 - 0.5 K/uL Incomplete  . Basophils Relative 05/03/2019 PENDING  % Incomplete  . Basophils Absolute 05/03/2019 PENDING  0.0 - 0.1 K/uL Incomplete  . WBC Morphology 05/03/2019 PENDING   Incomplete  . RBC Morphology 05/03/2019 PENDING   Incomplete  . Smear Review 05/03/2019 PENDING   Incomplete  . Other 05/03/2019 PENDING  % Incomplete  . nRBC 05/03/2019 PENDING  0 /100 WBC Incomplete  . Metamyelocytes Relative 05/03/2019 PENDING  % Incomplete  . Myelocytes 05/03/2019 PENDING  % Incomplete  . Promyelocytes Relative 05/03/2019 PENDING  % Incomplete  . Blasts 05/03/2019 PENDING  % Incomplete  . Immature Granulocytes 05/03/2019 PENDING  % Incomplete  . Abs Immature Granulocytes 05/03/2019 PENDING  0.00 - 0.07 K/uL Incomplete    Assessment:  LLOYDE LUDLAM is a 74 y.o. male with essential thrombocytosison hydroxyurea. Platelet count was 1,028,000 on 07/01/2014. Bone marrow biopsyin 08/2015revealed a myeloproliferative disorder. Lumbar spine MRIon 10/04/2013 revealed extensive marrow signal abnormality worrisome for multiple myeloma or marrow infiltrative process.  He is on hydroxyurea1000 mg 5 days/week and 500 mg 2 days/week (Mondays and Fridays).  He has B12 deficiencyand pernicious anemia. He is on oral B12. B12 was 894 on 07/24/2014, 801 on 04/09/2015, and 887 on 04/23/2019.  Folate was 10.1 on 04/23/2019.  He has evidence of hemolysis.  Haptoglobin was <10.  Total bilirubin was 3.2 (direct 0.2). Rect was 10.3%.  LDH was 195 (98-192). Coombs was negative.  Peripheral smear revealed macrocytosis with circulating nRBCs. There were large platelets. There was no evidence of circulating blasts.   He has hemolysis of unclear etiology.  He was  jaundiced at birth.  He was on folic acid years ago when he was younger.  At times, he noticed his urine was dark.  He underwent cholecystectomy for gallstones at age 39.  He is unaware of a family history of any blood disorders.  PNH by flow cytometry was negative in 2012 (no report available).  He has hemochromatosis.  He initially underwent frequent phlebotomy, but has not in some time.  Ferritin has been followed: 248 on 10/25/2012, 251 on 01/17/2013, 400 on 04/11/2013, 373 on 07/24/2014, 313  on 04/09/2015, 316 on 04/24/2018 and 172 on 04/23/2019.  He has a history of testicular cancers/p orchiectomy and radiation in 1977. He has Gilbert's disease.  He has a history ofmalignant melanomaon the lower back s/p biopsy on 03/05/2018 followed by wide excision on 03/16/2018. Clinical stagewas T1bN0Mx (stage IA). Lesion was 0.8 mm thick. He is followed at [facility].  He received the COVID-19 vaccine (second dose on 05/09/2019).  Symptomatically, he is slightly fatigued.  He denies any chest pain or shortness of breath.  Plan: 1.   Labs today: CBC with diff, BMP, LDH, G6PD assay, pyruvate kinase, SPEP, immunoglobulins, FLCA, cold agglutinins, ferritin, iron studies, AFP. 2.   Urinalysis. 3.Essential thrombocytosis Clinically, he is doing well. Hematocrit 41.8. Hemoglobin 15.5. MCV 114.2. Platelets 392,000 on 04/23/2019. Hematocrit 37.7.  Hemoglobin 14.1.  MCV 113.9.  Platelets 330,000 on 05/03/2019.  He is on hydroxyurea 1000 mg 5 days/week and 500 mg 2 days/week (Mondays and Fridays). Macrocytosis is felt likely from hydroxyurea.  4.B12 deficiency He has pernicious anemia. He is on oral B12. B12 and folate were normal on 04/23/2019. 5.   Hemolysis  Patient has a normal hematocrit/hemoglobin but elevated retic, elevated indirect bilirubin, and slightly elevated LDH.  Peripheral smear revealed  macrocytosis with circulating nRBCs. There were large platelets. There was no evidence of circulating blasts.   He has had 3 CBCs with an interfering substance precluding measurement of hemogloblin.  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  Review of prior labs:    LDH normal dating back to 2014 until 04/23/2019.   Bilirubin 2.4-4.1 (direct 0.1-0.2).   Retic 5.4%-10.3% last 2 years.  Unclear family history.  No known hemoglobinopathy or RBC membrane defect.  He is s/p cholecystectomy at a young age.  Patient notes one new cardiac medication.   Lisinopril has 1-10% incidence of hemolytic anemia.  He has aortic insufficiency - sheer across valves.  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  He denies any infections.  PNH testing was performed in 2012 (no report available).  Folic acid supplementation discussed. 6.   Hemochromatosis  Reviewed past history and frequent phlebotomies (none in years).  Ferritin has ranged between 300 - 400.  Ferritin goal 50-100.  May need to reinstitute phlebotomies.  Obtain copy of hemochromatosis assay from [facility].  Discuss AFP and abdominal ultrasound every 6 months. 7.   Abdominal ultrasound. 8.   RTC in 3 weeks for MD assessment, labs (CBC +/- others), and +/- phlebotomy.  I discussed the assessment and treatment plan with the patient.  The patient was provided an opportunity to ask questions and all were answered.  The patient agreed with the plan and demonstrated an understanding of the instructions.  The patient was advised to call back if the symptoms worsen or if the condition fails to improve as anticipated.  I provided 27 minutes of face-to-face time during this this encounter and > 50% was spent counseling as documented under my  assessment and plan.  An additional 10-15 minutes were spent reviewing his chart (Epic and Care Everywhere) including notes, labs, and imaging studies.   Lequita Asal, MD, PhD    05/03/2019, 9:55 AM  I, Selena Batten, am acting as scribe for [employer]. Mike Gip, MD, PhD.  I, Eular Panek C. Mike Gip, MD, have reviewed the above documentation for accuracy and completeness,  and I agree with the above.

## 2019-05-02 ENCOUNTER — Encounter: Payer: Self-pay | Admitting: Hematology and Oncology

## 2019-05-02 NOTE — Progress Notes (Signed)
The patient blood pressure is high today 165/65 the patient states he has taking his medication 1 hour ago. The patient Name and DOB has been verified by phone today.   The patient left before I was able to get a repeat blood pressure.

## 2019-05-03 ENCOUNTER — Other Ambulatory Visit: Payer: Self-pay

## 2019-05-03 ENCOUNTER — Inpatient Hospital Stay (HOSPITAL_BASED_OUTPATIENT_CLINIC_OR_DEPARTMENT_OTHER): Payer: Medicare Other | Admitting: Hematology and Oncology

## 2019-05-03 ENCOUNTER — Encounter: Payer: Self-pay | Admitting: Hematology and Oncology

## 2019-05-03 ENCOUNTER — Inpatient Hospital Stay: Payer: Medicare Other

## 2019-05-03 VITALS — BP 165/71 | HR 74 | Temp 97.6°F | Resp 18 | Ht 65.0 in | Wt 167.1 lb

## 2019-05-03 DIAGNOSIS — R778 Other specified abnormalities of plasma proteins: Secondary | ICD-10-CM

## 2019-05-03 DIAGNOSIS — D473 Essential (hemorrhagic) thrombocythemia: Secondary | ICD-10-CM

## 2019-05-03 DIAGNOSIS — R779 Abnormality of plasma protein, unspecified: Secondary | ICD-10-CM | POA: Diagnosis not present

## 2019-05-03 DIAGNOSIS — D598 Other acquired hemolytic anemias: Secondary | ICD-10-CM

## 2019-05-03 LAB — FERRITIN: Ferritin: 172 ng/mL (ref 24–336)

## 2019-05-03 LAB — IRON AND TIBC
Iron: 251 ug/dL — ABNORMAL HIGH (ref 45–182)
Saturation Ratios: 85 % — ABNORMAL HIGH (ref 17.9–39.5)
TIBC: 297 ug/dL (ref 250–450)
UIBC: 46 ug/dL

## 2019-05-03 LAB — URINALYSIS, COMPLETE (UACMP) WITH MICROSCOPIC
Bilirubin Urine: NEGATIVE
Glucose, UA: NEGATIVE mg/dL
Hgb urine dipstick: NEGATIVE
Ketones, ur: NEGATIVE mg/dL
Leukocytes,Ua: NEGATIVE
Nitrite: NEGATIVE
Protein, ur: NEGATIVE mg/dL
RBC / HPF: NONE SEEN RBC/hpf (ref 0–5)
Specific Gravity, Urine: 1.025 (ref 1.005–1.030)
pH: 5.5 (ref 5.0–8.0)

## 2019-05-03 LAB — CBC WITH DIFFERENTIAL/PLATELET
Abs Immature Granulocytes: 0.04 10*3/uL (ref 0.00–0.07)
Basophils Absolute: 0 10*3/uL (ref 0.0–0.1)
Basophils Relative: 1 %
Eosinophils Absolute: 0.1 10*3/uL (ref 0.0–0.5)
Eosinophils Relative: 1 %
HCT: 37.7 % — ABNORMAL LOW (ref 39.0–52.0)
Hemoglobin: 14.1 g/dL (ref 13.0–17.0)
Immature Granulocytes: 1 %
Lymphocytes Relative: 13 %
Lymphs Abs: 0.5 10*3/uL — ABNORMAL LOW (ref 0.7–4.0)
MCH: 42.6 pg — ABNORMAL HIGH (ref 26.0–34.0)
MCHC: 37.4 g/dL — ABNORMAL HIGH (ref 30.0–36.0)
MCV: 113.9 fL — ABNORMAL HIGH (ref 80.0–100.0)
Monocytes Absolute: 0.4 10*3/uL (ref 0.1–1.0)
Monocytes Relative: 10 %
Neutro Abs: 3.2 10*3/uL (ref 1.7–7.7)
Neutrophils Relative %: 74 %
Platelets: 330 10*3/uL (ref 150–400)
RBC: 3.31 MIL/uL — ABNORMAL LOW (ref 4.22–5.81)
RDW: 17.1 % — ABNORMAL HIGH (ref 11.5–15.5)
WBC: 4.2 10*3/uL (ref 4.0–10.5)
nRBC: 0 % (ref 0.0–0.2)

## 2019-05-03 LAB — BASIC METABOLIC PANEL
Anion gap: 6 (ref 5–15)
BUN: 22 mg/dL (ref 8–23)
CO2: 24 mmol/L (ref 22–32)
Calcium: 9 mg/dL (ref 8.9–10.3)
Chloride: 106 mmol/L (ref 98–111)
Creatinine, Ser: 0.58 mg/dL — ABNORMAL LOW (ref 0.61–1.24)
GFR calc Af Amer: >60 mL/min (ref >60)
GFR calc non Af Amer: >60 mL/min (ref >60)
Glucose, Bld: 95 mg/dL (ref 70–99)
Potassium: 4.1 mmol/L (ref 3.5–5.1)
Sodium: 136 mmol/L (ref 135–145)

## 2019-05-03 LAB — LACTATE DEHYDROGENASE: LDH: 161 U/L (ref 98–192)

## 2019-05-04 LAB — AFP TUMOR MARKER: AFP, Serum, Tumor Marker: 2.2 ng/mL (ref 0.0–8.3)

## 2019-05-05 LAB — GLUCOSE 6 PHOSPHATE DEHYDROGENASE
G6PDH: 15 U/g{Hb} (ref 4.8–15.7)
Hemoglobin: 14.3 g/dL (ref 13.0–17.7)

## 2019-05-06 LAB — KAPPA/LAMBDA LIGHT CHAINS
Kappa free light chain: 12.1 mg/L (ref 3.3–19.4)
Kappa, lambda light chain ratio: 0.9 (ref 0.26–1.65)
Lambda free light chains: 13.4 mg/L (ref 5.7–26.3)

## 2019-05-06 LAB — COLD AGGLUTININ TITER: Cold Agglutinin Titer: NEGATIVE

## 2019-05-07 LAB — MULTIPLE MYELOMA PANEL, SERUM
Albumin SerPl Elph-Mcnc: 4.2 g/dL (ref 2.9–4.4)
Albumin/Glob SerPl: 2.5 — ABNORMAL HIGH (ref 0.7–1.7)
Alpha 1: 0.2 g/dL (ref 0.0–0.4)
Alpha2 Glob SerPl Elph-Mcnc: 0.4 g/dL (ref 0.4–1.0)
B-Globulin SerPl Elph-Mcnc: 0.6 g/dL — ABNORMAL LOW (ref 0.7–1.3)
Gamma Glob SerPl Elph-Mcnc: 0.4 g/dL (ref 0.4–1.8)
Globulin, Total: 1.7 g/dL — ABNORMAL LOW (ref 2.2–3.9)
IgA: 99 mg/dL (ref 61–437)
IgG (Immunoglobin G), Serum: 498 mg/dL — ABNORMAL LOW (ref 603–1613)
IgM (Immunoglobulin M), Srm: 43 mg/dL (ref 15–143)
Total Protein ELP: 5.9 g/dL — ABNORMAL LOW (ref 6.0–8.5)

## 2019-05-08 ENCOUNTER — Other Ambulatory Visit: Payer: Self-pay

## 2019-05-08 ENCOUNTER — Ambulatory Visit
Admission: RE | Admit: 2019-05-08 | Discharge: 2019-05-08 | Disposition: A | Discharge status: Home / Self Care | Payer: Medicare Other | Source: Ambulatory Visit | Attending: Hematology and Oncology | Admitting: Hematology and Oncology

## 2019-05-08 DIAGNOSIS — D473 Essential (hemorrhagic) thrombocythemia: Secondary | ICD-10-CM

## 2019-05-09 LAB — PYRUVATE KINASE: Pyruvate Kinase (PK): 11.6 U/g{Hb} — ABNORMAL HIGH (ref 4.6–11.2)

## 2019-05-23 NOTE — Progress Notes (Signed)
[facility]  [address], [address] [place], [place] [address] Phone: [phone]  Fax: [phone]   Clinic Day:  05/24/2019  Referring physician: Barbaraann Boys, MD  Chief Complaint: Drew Gardner is a 74 y.o. 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   HPI: The patient was last seen in the hematology clinic on 05/03/2019. At that time, he was slightly fatigued.  He denied any chest pain or shortness of breath.  He was noted to have a normal hemoglobin but elevated retic, elevated indirect bilirubin, and slightly elevated LDH suggesting hemolysis.  Peripheral smear revealed macrocytosis with circulating nRBCs. There were large platelets. There was no evidence of circulating blasts. He has had 3 CBCs with an interfering substance precluding measurement of hemogloblin. 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  He had no family history of a hemoglobinopathy or RBC membrane defect.  Prior [facility] testing was negative (no report available).  Work-up included a hematocrit of 37.7, hemoglobin 14.1, MCV 113.9, platelets 330,000, WBC 4200 with an ANC of 3200.  Creatinine was 0.58.  LDH was 161.  Cold agglutinins negative, urinalysis with 0 RBC/HPF and negative bilirubin.  SPEP revealed no monoclonal protein.  IgG was 498 (low).  Kappa free light chains 12.1, lambda free light chains 13.4 and ratio 0.90 (0.26-1.65).  Pyruvate kinase was 11.6 (4.6-11.2).  G6PD was 15.0 (4.8-15.7).  Ferritin was 172 and iron saturation of 85% and a TIBC of 297.  AFP was 2.2.  Abdominal ultrasound on 05/08/2019 revealed 3 known cysts, the largest contains a septation and measures 5.3 x 3.1 x 3.3 cm in the right lobe (previously 3.0 x 2.5 x 3.1 cm). A smaller septated cyst in the right lobe measures 1.3 x 1.2 x 1.2 cm (previously 1.5 x 1.2 x 1.4 cm), while a cyst in the left lobe measures  2.2 x 1.7 x 1.5 cm (previously 1.9 x 1.2 x 1.9 cm). There was persistent mild splenomegaly.  There was a right pleural effusion.  During the interim, he has felt "ok".  His blood pressure is elevated as he did not take his blood pressure medication this morning. He notes that he has been craving salt for the last few days.    He recounted ultrasound guided thoracentesis on 05/08/2018 which yielded a total of approximately 750 mL of yellow fluid.  His colonoscopy was about two years ago and was unremarkable.  EGD in 10/06/2017 performed by Dr Vira Agar revealed esophageal mucosal changes c/w short segment Barrett's esophagus (pathology confirmed with no dysplasia or malignancy).  There were multiple gastric polyps There was a nodule in the duodenum (reactive duodenitis).    Past Medical History:  Diagnosis Date  . Anemia   . Arthritis   . Barrett's esophagus   . Dependent edema    chronic  . Essential thrombocytosis ([place])   . Flow murmur   . GERD (gastroesophageal reflux disease)   . Glaucoma   . Hemochromatosis   . Hypertension   . Testicular cancer [facility])    when patient ws 74 years old    Past Surgical History:  Procedure Laterality Date  . CHOLECYSTECTOMY    . ESOPHAGOGASTRODUODENOSCOPY (EGD) WITH PROPOFOL N/A 10/06/2017   Procedure: ESOPHAGOGASTRODUODENOSCOPY (EGD) WITH PROPOFOL;  Surgeon: Manya Silvas, MD;  Location: [facility] ENDOSCOPY;  Service: Endoscopy;  Laterality: N/A;  . ORCHIECTOMY Right    when he was 74 years old  Family History  Problem Relation Age of Onset  . Stroke Mother   . Stroke Father     Social History:  reports that he has never smoked. He has never used smokeless tobacco. He reports current alcohol use of about 1.0 standard drinks of alcohol per week. He reports that he does not use drugs. He has no family history of any blood disorders. The patient lives in [place]. The patient is alone today.   Allergies: No Known Allergies  Current  Medications: Current Outpatient Medications  Medication Sig Dispense Refill  . amLODipine (NORVASC) 5 MG tablet Take 5 mg by mouth daily.    Marland Kitchen aspirin 81 MG tablet Take 81 mg by mouth daily.    . brimonidine-timolol (COMBIGAN) 0.2-0.5 % ophthalmic solution Apply 0.2 drops to eye daily. Both eyes    . furosemide (LASIX) 20 MG tablet Take 1 tablet by mouth daily.    . hydroxyurea (HYDREA) 500 MG capsule TAKE 1 CAPSULE BY MOUTH MONDAY AND FRIDAY AND 2 CAPSULES DAILY THE REST OF THE WEEK 180 capsule 0  . lisinopril (PRINIVIL,ZESTRIL) 10 MG tablet Take 10 mg by mouth daily.     . meloxicam (MOBIC) 15 MG tablet daily as needed.     . pantoprazole (PROTONIX) 40 MG tablet Take 40 mg by mouth daily.    . potassium chloride (KLOR-CON) 20 MEQ packet Take 20 mEq by mouth daily.     . sucralfate (CARAFATE) 1 g tablet Take 1 g by mouth 4 (four) times daily -  with meals and at bedtime.    . vitamin B-12 (CYANOCOBALAMIN) 1000 MCG tablet Take 1,000 mcg by mouth daily.     No current facility-administered medications for this visit.    Review of Systems  Constitutional: Negative for chills, diaphoresis, fever and weight loss (up 2 lbs; fluctuates).       Doing "ok".  HENT: Negative.  Negative for congestion, ear pain, hearing loss, nosebleeds, sinus pain and sore throat.   Eyes: Negative for blurred vision, double vision and photophobia.       Occasional yellow eyes.  Respiratory: Negative.  Negative for cough, hemoptysis, sputum production and shortness of breath.   Cardiovascular: Negative.  Negative for chest pain, palpitations, orthopnea, leg swelling and PND.       Leaky heart valve.  Gastrointestinal: Negative.  Negative for abdominal pain, blood in stool, constipation, diarrhea, heartburn (on Protonix), melena, nausea and vomiting.       Colonoscopy 2 years ago; EGD 10/06/2017.  Genitourinary: Negative.  Negative for dysuria, frequency, hematuria and urgency.       Occasional dark urine.   Musculoskeletal: Negative.  Negative for back pain, joint pain, myalgias and neck pain.  Skin: Negative.  Negative for itching and rash.  Neurological: Negative.  Negative for dizziness, tingling, sensory change, speech change, focal weakness, weakness and headaches.  Endo/Heme/Allergies: Negative.  Does not bruise/bleed easily.  Psychiatric/Behavioral: Negative for depression, memory loss and suicidal ideas. The patient is not nervous/anxious and does not have insomnia.   All other systems reviewed and are negative.  Performance status (ECOG):  0  Vitals Blood pressure (!) 169/56, pulse 75, temperature (!) 96.3 F (35.7 C), temperature source Tympanic, resp. rate 18, height '5\' 5"'$  (1.651 m), weight 169 lb 13.8 oz (77 kg), SpO2 99 %.   Physical Exam  Constitutional: He appears well-developed and well-nourished. No distress.  HENT:  Head: Normocephalic and atraumatic.  Cap.  Short gray hair.  Mask.  Eyes: Conjunctivae  and EOM are normal. No scleral icterus.  Glasses.  Blue eyes.  Skin: He is not diaphoretic.  Psychiatric: He has a normal mood and affect. His behavior is normal. Judgment and thought content normal.  Nursing note and vitals reviewed.   Appointment on 05/24/2019  Component Date Value Ref Range Status  . WBC 05/24/2019 3.8* 4.0 - 10.5 K/uL Final  . RBC 05/24/2019 3.33* 4.22 - 5.81 MIL/uL Final  . Hemoglobin 05/24/2019 14.4  13.0 - 17.0 g/dL Final  . HCT 05/24/2019 38.5* 39.0 - 52.0 % Final  . MCV 05/24/2019 115.6* 80.0 - 100.0 fL Final  . MCH 05/24/2019 43.2* 26.0 - 34.0 pg Final  . MCHC 05/24/2019 37.4* 30.0 - 36.0 g/dL Final  . RDW 05/24/2019 17.5* 11.5 - 15.5 % Final  . Platelets 05/24/2019 344  150 - 400 K/uL Final  . nRBC 05/24/2019 0.5* 0.0 - 0.2 % Final   Performed at [facility], [address] [place], [place] [address]    Assessment:  Drew Gardner is a 74 y.o. male with essential thrombocytosison hydroxyurea (began 08/2014). CALR  mutation was +.  Platelet count was 1,028,000 on 07/01/2014. Bone marrow biopsyin 08/2015revealed a myeloproliferative disorder. Lumbar spine MRIon 10/04/2013 revealed extensive marrow signal abnormality worrisome for a marrow infiltrative process.  He is on hydroxyurea1000 mg 5 days/week and 500 mg 2 days/week (Mondays and Fridays).  He has B12 deficiencyand pernicious anemia.  Intrinsic factor antibody was positive in 08/2019.  He is on oral B12. B12 was 894 on 07/24/2014, 801 on 04/09/2015, and 887 on 04/23/2019.  Folate was 10.1 on 04/23/2019.  He has evidence of hemolysis.  Haptoglobin was <10 on 04/23/2019.  Total bilirubin was 3.2 (direct 0.2). Rect was 10.3%.  LDH was 195 (98-192). Coombs was negative.  Peripheral smear revealed macrocytosis with circulating nRBCs. There were large platelets. There was no evidence of circulating blasts.   He has hemolysis of unclear etiology.  He was jaundiced at birth.  He was on folic acid years ago when he was younger.  At times, he noticed his urine was dark.  He underwent cholecystectomy for gallstones at age 24.  He is unaware of a family history of any blood disorders.  PNH by flow cytometry was negative on 10/29/2012.  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  He initially underwent frequent phlebotomy, but has not in some time.  Hemochromatosis assay on 05/30/2013 revealed a single mutation (H63D).  Abdominal ultrasound on 05/08/2019 revealed 3 known cysts, the largest contains a septation and measures 5.3 x 3.1 x 3.3 cm in the right lobe (previously 3.0 x 2.5 x 3.1 cm). A smaller septated cyst in the right lobe measures 1.3 x 1.2 x 1.2 cm (previously 1.5 x 1.2 x 1.4 cm), while a cyst in the left lobe measures 2.2 x 1.7 x 1.5 cm (previously 1.9 x 1.2 x 1.9 cm). There was persistent mild splenomegaly.  There was a right pleural effusion.  AFP was 2.2on 05/03/2019.  Ferritin has been followed: 248 on 10/25/2012, 251 on 01/17/2013, 400 on  04/11/2013, 373 on 07/24/2014, 313 on 04/09/2015, 316 on 04/24/2018 and 172 on 04/23/2019.  He has a history of testicular cancers/p orchiectomy and radiation in 1977. He has Gilbert's disease.  He has a history ofmalignant melanomaon the lower back s/p biopsy on 03/05/2018 followed by wide excision on 03/16/2018. Clinical stagewas T1bN0Mx (stage IA). Lesion was 0.8 mm thick. He is followed at [facility].  He has Barrett's esophagus.  He received the COVID-19 vaccine (second dose on 05/09/2019).  Symptomatically, he denies any new concerns.  Plan: 1.   Labs today:  CBC. 2.   Essential thrombocytosis CALR mutation is positive.  Clinically, he is doing well. Hematocrit 37.7.  Hemoglobin 14.1.  MCV 113.9.  Platelets 330,000 on 05/03/2019.  Hematocrit 38.5. Hemoglobin 14.4. MCV 115.6. Platelets 344,000 on 05/24/2019.  He remains on hydroxyurea 1000 mg 5 days/week and 500 mg 2 days/week (Mondays and Fridays). Macrocytosis is secondary to hydroxyurea.  3.B12 deficiency He has pernicious anemia. Intrinsic factor antibody was positive in 08/2019.   He remains on oral B12. B12 and folate were normal on 04/23/2019.  Check B12 annually unless change in counts. 4.   Hemolysis  Patient has a normal hematocrit/hemoglobin but elevated retic, elevated indirect bilirubin, and slightly elevated LDH.  Peripheral smear revealed macrocytosis with circulating nRBCs. There were large platelets. There was no evidence of circulating blasts.   He has had 3 CBCs with an interfering substance precluding measurement of hemogloblin.  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  Review of prior labs:    LDH normal dating back to 2014 until 04/23/2019.   Bilirubin 2.4-4.1 (direct 0.1-0.2).   Retic 5.4%-10.3% last 2 years.   PNH testing negative on 10/29/2012.   Coombs and  cold agglutinins were negative on 04/23/2019.  G6PD and pyruvate kinase were negative on 05/03/2019.   Unclear family history.  No known hemoglobinopathy or RBC membrane defect.  He is s/p cholecystectomy at a young age.  Patient notes one new cardiac medication.   Lisinopril has 1-10% incidence of hemolytic anemia.  He has aortic insufficiency - sheer across valves.  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  He denies any infections.  Folate was 10.1 on 04/23/2019. 5.   Hemochromatosis  Reviewed past history and frequent phlebotomies (none in years).  Ferritin has ranged between 300 - 400.  Ferritin 172 on 05/03/2019.   Unusual without phlebotomy, good diet and no bleeding.   Patient notes colonoscopy 2 years ago (no report available) negative.   EGD 10/06/2017 revealed Barrett's without evidence of GI blood loss.   Urinalysis on 04/23/2019 revealed no hematuria.  Original hemochromatosis testing obtained after clinic confirmed patient is a carrier (single mutation H63D).   No need for phlebotomies.  AFP was normal  Abdominal ultrasound revealed multiple cysts.   Discuss follow-up with GI.  6.   MD to review data from [facility] (performed after clinic). 7.   RTC next week for labs (HCT/Hgb) and +/- phlebotomy 250 cc if hemoglobin is normal. 8.   RTC in 3 months MD assessment, labs (CBC, ferritin, iron studies), and +/- phlebotomy.  Addendum:  Update on patient's medical records were included in today's assessment and plan.  He does NOT have hemochromatosis.  He is a carrier.  He does not need phlebotomies.  Appointments for phlebotomies will be cancelled.  He has a CALR mutation regarding his myeloproliferative disorder.  Etiology of hemolysis felt related to intramedullary hemolysis due to his underlying myeloproliferative  disorder.  I discussed the assessment and treatment plan with the patient.  The patient was provided an opportunity to ask questions and all were answered.  The patient agreed with the plan and demonstrated an understanding of the instructions.  The patient was advised to call back if the  symptoms worsen or if the condition fails to improve as anticipated.  I provided 19 minutes (10:51 AM - 11:10 AM) of face-to-face time during this this encounter and > 50% was spent counseling as documented under my assessment and plan.  An additional 5 minutes were spent reviewing records in [place].   Lequita Asal, MD, PhD    05/24/2019, 10:56 AM  I, Jacqualyn Posey, am acting as a Education administrator for [employer]. Mike Gip, MD.   I, Jacquline Terrill C. Mike Gip, MD, have reviewed the above documentation for accuracy and completeness, and I agree with the above.

## 2019-05-24 ENCOUNTER — Other Ambulatory Visit: Payer: Self-pay

## 2019-05-24 ENCOUNTER — Inpatient Hospital Stay: Payer: Medicare Other

## 2019-05-24 ENCOUNTER — Inpatient Hospital Stay: Payer: Medicare Other | Attending: Hematology and Oncology | Admitting: Hematology and Oncology

## 2019-05-24 ENCOUNTER — Encounter: Payer: Self-pay | Admitting: Hematology and Oncology

## 2019-05-24 VITALS — BP 169/56 | HR 75 | Temp 96.3°F | Resp 18 | Ht 65.0 in | Wt 169.9 lb

## 2019-05-24 DIAGNOSIS — D598 Other acquired hemolytic anemias: Secondary | ICD-10-CM | POA: Diagnosis not present

## 2019-05-24 DIAGNOSIS — D510 Vitamin B12 deficiency anemia due to intrinsic factor deficiency: Secondary | ICD-10-CM | POA: Insufficient documentation

## 2019-05-24 DIAGNOSIS — Z9079 Acquired absence of other genital organ(s): Secondary | ICD-10-CM | POA: Diagnosis not present

## 2019-05-24 DIAGNOSIS — Z8547 Personal history of malignant neoplasm of testis: Secondary | ICD-10-CM | POA: Insufficient documentation

## 2019-05-24 DIAGNOSIS — Z923 Personal history of irradiation: Secondary | ICD-10-CM | POA: Insufficient documentation

## 2019-05-24 DIAGNOSIS — D473 Essential (hemorrhagic) thrombocythemia: Secondary | ICD-10-CM | POA: Diagnosis not present

## 2019-05-24 DIAGNOSIS — K2270 Barrett's esophagus without dysplasia: Secondary | ICD-10-CM

## 2019-05-24 DIAGNOSIS — D7589 Other specified diseases of blood and blood-forming organs: Secondary | ICD-10-CM | POA: Diagnosis not present

## 2019-05-24 DIAGNOSIS — E538 Deficiency of other specified B group vitamins: Secondary | ICD-10-CM | POA: Diagnosis not present

## 2019-05-24 DIAGNOSIS — I10 Essential (primary) hypertension: Secondary | ICD-10-CM | POA: Diagnosis not present

## 2019-05-24 DIAGNOSIS — Z79899 Other long term (current) drug therapy: Secondary | ICD-10-CM | POA: Diagnosis not present

## 2019-05-24 LAB — CBC
HCT: 38.5 % — ABNORMAL LOW (ref 39.0–52.0)
Hemoglobin: 14.4 g/dL (ref 13.0–17.0)
MCH: 43.2 pg — ABNORMAL HIGH (ref 26.0–34.0)
MCHC: 37.4 g/dL — ABNORMAL HIGH (ref 30.0–36.0)
MCV: 115.6 fL — ABNORMAL HIGH (ref 80.0–100.0)
Platelets: 344 10*3/uL (ref 150–400)
RBC: 3.33 MIL/uL — ABNORMAL LOW (ref 4.22–5.81)
RDW: 17.5 % — ABNORMAL HIGH (ref 11.5–15.5)
WBC: 3.8 10*3/uL — ABNORMAL LOW (ref 4.0–10.5)
nRBC: 0.5 % — ABNORMAL HIGH (ref 0.0–0.2)

## 2019-05-24 MED ORDER — HYDROXYUREA 500 MG PO CAPS: ORAL_CAPSULE | ORAL | 0 refills | Status: DC

## 2019-05-24 NOTE — Progress Notes (Signed)
No new changes noted today 

## 2019-05-25 DIAGNOSIS — K2270 Barrett's esophagus without dysplasia: Secondary | ICD-10-CM | POA: Insufficient documentation

## 2019-05-27 ENCOUNTER — Telehealth: Payer: Self-pay

## 2019-05-27 NOTE — Telephone Encounter (Signed)
-----   Message from Lequita Asal, MD sent at 05/25/2019 12:28 PM EDT -----  Cour[place], please call patient (see below).  Robin, please cancel appts for lab checks with +/- phlebotomies.  Old records obtained. He does not have hemochromatosis.  He is a carrier.  No phlebotomies needed.  Let's make the 08/26/2019 appt for labs only (CBC with diff, CMP) as he is on hydrea.  Keep the August appointments.  M

## 2019-05-27 NOTE — Telephone Encounter (Signed)
Left a message to inform him about his dx. / I was able to leave message for the patient to return the call as soon as possible.

## 2019-05-30 ENCOUNTER — Telehealth: Payer: Self-pay

## 2019-05-30 NOTE — Telephone Encounter (Signed)
Spoke with the patient to inform him that per dr Mike Gip and old records he do not need phlebotomy or Labs at this time.  The patient was understanding and agreeable.

## 2019-05-31 ENCOUNTER — Other Ambulatory Visit: Payer: Medicare Other

## 2019-07-23 ENCOUNTER — Other Ambulatory Visit: Payer: Medicare Other

## 2019-08-09 ENCOUNTER — Other Ambulatory Visit: Payer: Self-pay | Admitting: Hematology and Oncology

## 2019-08-09 DIAGNOSIS — D473 Essential (hemorrhagic) thrombocythemia: Secondary | ICD-10-CM

## 2019-08-20 ENCOUNTER — Other Ambulatory Visit: Payer: Self-pay

## 2019-08-26 ENCOUNTER — Inpatient Hospital Stay: Payer: Medicare Other | Attending: Hematology and Oncology

## 2019-08-26 ENCOUNTER — Other Ambulatory Visit: Payer: Self-pay

## 2019-08-26 ENCOUNTER — Other Ambulatory Visit: Payer: Self-pay | Admitting: Hematology and Oncology

## 2019-08-26 ENCOUNTER — Ambulatory Visit: Payer: Medicare Other | Admitting: Hematology and Oncology

## 2019-08-26 DIAGNOSIS — E538 Deficiency of other specified B group vitamins: Secondary | ICD-10-CM | POA: Insufficient documentation

## 2019-08-26 DIAGNOSIS — R7989 Other specified abnormal findings of blood chemistry: Secondary | ICD-10-CM | POA: Diagnosis not present

## 2019-08-26 DIAGNOSIS — R17 Unspecified jaundice: Secondary | ICD-10-CM | POA: Diagnosis not present

## 2019-08-26 DIAGNOSIS — Z8547 Personal history of malignant neoplasm of testis: Secondary | ICD-10-CM | POA: Insufficient documentation

## 2019-08-26 DIAGNOSIS — D473 Essential (hemorrhagic) thrombocythemia: Secondary | ICD-10-CM | POA: Insufficient documentation

## 2019-08-26 LAB — CBC WITH DIFFERENTIAL/PLATELET
Abs Immature Granulocytes: 0.05 10*3/uL (ref 0.00–0.07)
Basophils Absolute: 0.1 10*3/uL (ref 0.0–0.1)
Basophils Relative: 1 %
Eosinophils Absolute: 0.1 10*3/uL (ref 0.0–0.5)
Eosinophils Relative: 1 %
HCT: 36.8 % — ABNORMAL LOW (ref 39.0–52.0)
Hemoglobin: 13.4 g/dL (ref 13.0–17.0)
Immature Granulocytes: 1 %
Lymphocytes Relative: 13 %
Lymphs Abs: 0.6 10*3/uL — ABNORMAL LOW (ref 0.7–4.0)
MCH: 43.2 pg — ABNORMAL HIGH (ref 26.0–34.0)
MCHC: 36.4 g/dL — ABNORMAL HIGH (ref 30.0–36.0)
MCV: 118.7 fL — ABNORMAL HIGH (ref 80.0–100.0)
Monocytes Absolute: 0.5 10*3/uL (ref 0.1–1.0)
Monocytes Relative: 10 %
Neutro Abs: 3.6 10*3/uL (ref 1.7–7.7)
Neutrophils Relative %: 74 %
Platelets: 285 10*3/uL (ref 150–400)
RBC: 3.1 MIL/uL — ABNORMAL LOW (ref 4.22–5.81)
RDW: 15.7 % — ABNORMAL HIGH (ref 11.5–15.5)
WBC: 4.9 10*3/uL (ref 4.0–10.5)
nRBC: 0.6 % — ABNORMAL HIGH (ref 0.0–0.2)

## 2019-08-26 LAB — COMPREHENSIVE METABOLIC PANEL
ALT: 16 U/L (ref 0–44)
AST: 16 U/L (ref 15–41)
Albumin: 4.3 g/dL (ref 3.5–5.0)
Alkaline Phosphatase: 51 U/L (ref 38–126)
Anion gap: 6 (ref 5–15)
BUN: 13 mg/dL (ref 8–23)
CO2: 27 mmol/L (ref 22–32)
Calcium: 9 mg/dL (ref 8.9–10.3)
Chloride: 104 mmol/L (ref 98–111)
Creatinine, Ser: 0.74 mg/dL (ref 0.61–1.24)
GFR calc Af Amer: >60 mL/min (ref >60)
GFR calc non Af Amer: >60 mL/min (ref >60)
Glucose, Bld: 100 mg/dL — ABNORMAL HIGH (ref 70–99)
Potassium: 3.7 mmol/L (ref 3.5–5.1)
Sodium: 137 mmol/L (ref 135–145)
Total Bilirubin: 3.3 mg/dL — ABNORMAL HIGH (ref 0.3–1.2)
Total Protein: 6.7 g/dL (ref 6.5–8.1)

## 2019-08-26 LAB — FERRITIN: Ferritin: 201 ng/mL (ref 24–336)

## 2019-08-26 LAB — BILIRUBIN, DIRECT: Bilirubin, Direct: 0.3 mg/dL — ABNORMAL HIGH (ref 0.0–0.2)

## 2019-08-27 LAB — CEA: CEA: 0.8 ng/mL (ref 0.0–4.7)

## 2019-09-23 ENCOUNTER — Other Ambulatory Visit
Admission: RE | Admit: 2019-09-23 | Discharge: 2019-09-23 | Disposition: A | Discharge status: Home / Self Care | Payer: Medicare Other | Source: Ambulatory Visit | Attending: Rehabilitative and Restorative Service Providers" | Admitting: Rehabilitative and Restorative Service Providers"

## 2019-09-23 DIAGNOSIS — R609 Edema, unspecified: Secondary | ICD-10-CM | POA: Diagnosis present

## 2019-09-23 DIAGNOSIS — I080 Rheumatic disorders of both mitral and aortic valves: Secondary | ICD-10-CM | POA: Insufficient documentation

## 2019-09-23 DIAGNOSIS — R0602 Shortness of breath: Secondary | ICD-10-CM | POA: Diagnosis not present

## 2019-09-23 LAB — BRAIN NATRIURETIC PEPTIDE: B Natriuretic Peptide: 91.8 pg/mL (ref 0.0–100.0)

## 2019-10-21 NOTE — Progress Notes (Signed)
[facility]  [address] [place], [place] [address] Phone: [phone]  Fax: [phone]   Clinic Day:  10/22/2019  Referring physician: Barbaraann Boys, MD  Chief Complaint: Drew Gardner is a 74 y.o. 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   HPI: The patient was last seen in the hematology clinic on 05/24/2019. At that time, he had no new concerns. Hematocrit was 38.5, hemoglobin 14.4, platelets 344,000, WBC 3,800.  He remained on hydroxyurea 1000 mg 5 days/week and 500 mg 2 days/week (Mondays and Fridays).  The patient received three Orthovisc injections to both knees without complications on 05/39/7673, 08/06/2019, and 08/13/2019.  Labs on 08/26/2019 revealed a hematocrit 36.8, hemoglobin 13.4, platelets 285,000, WBC 4,900. Total bilirubin was 3.3 (direct 0.3). Ferritin 201. CEA 0.8.  The patient saw Doristine Mango, [place] in the cardiology clinic on 09/23/2019. He reported exertional shortness of breath, orthopnea, and increased lower extremity swelling. CBC revealed a hematocrit of 35.5, hemoglobin 12.8, MCV 124.6, platelets 277,000, WBC 3900.  Potassium was 5.3.  Creatinine 0.9.  TSH was 2.582. He had a CXR and he states that "it was good."  During the interim, he has been "excellent." He has lymphedema in his right leg. His shortness of breath has resolved. He states that his weight fluctuates around 159 lbs. He does not snack as much as he used to and his appetite has been slightly decreased. His eyes occasionally get yellow and his urine is dark in the mornings.  The patient is still taking Hydroxyurea 1000 mg 5 days/week and 500 mg 2 days/week. He is no longer taking Lisinopril. He is up to date on his colonoscopies.   Past Medical History:  Diagnosis Date  . Anemia   . Arthritis   . Barrett's esophagus   . Dependent edema    chronic  . Essential thrombocytosis ([place])   . Flow murmur   . GERD  (gastroesophageal reflux disease)   . Glaucoma   . Hemochromatosis   . Hypertension   . Testicular cancer [facility])    when patient ws 74 years old    Past Surgical History:  Procedure Laterality Date  . CHOLECYSTECTOMY    . ESOPHAGOGASTRODUODENOSCOPY (EGD) WITH PROPOFOL N/A 10/06/2017   Procedure: ESOPHAGOGASTRODUODENOSCOPY (EGD) WITH PROPOFOL;  Surgeon: Manya Silvas, MD;  Location: [facility] ENDOSCOPY;  Service: Endoscopy;  Laterality: N/A;  . ORCHIECTOMY Right    when he was 74 years old    Family History  Problem Relation Age of Onset  . Stroke Mother   . Stroke Father     Social History:  reports that he has never smoked. He has never used smokeless tobacco. He reports current alcohol use of about 1.0 standard drink of alcohol per week. He reports that he does not use drugs. He has no family history of any blood disorders. The patient lives in [place]. The patient is alone today.   Allergies: No Known Allergies  Current Medications: Current Outpatient Medications  Medication Sig Dispense Refill  . amLODipine (NORVASC) 5 MG tablet Take 5 mg by mouth daily.    . brimonidine-timolol (COMBIGAN) 0.2-0.5 % ophthalmic solution Apply 0.2 drops to eye daily. Both eyes    . furosemide (LASIX) 20 MG tablet Take 1 tablet by mouth daily.    . hydroxyurea (HYDREA) 500 MG capsule TAKE 1 CAPSULE BY MOUTH MONDAY AND FRIDAY AND 2 CAPSULES DAILY THE REST OF THE WEEK 144 capsule 1  .  meloxicam (MOBIC) 15 MG tablet daily as needed.     . pantoprazole (PROTONIX) 40 MG tablet Take 40 mg by mouth daily.    . vitamin B-12 (CYANOCOBALAMIN) 1000 MCG tablet Take 1,000 mcg by mouth daily.    Marland Kitchen aspirin 81 MG tablet Take 81 mg by mouth daily. (Patient not taking: Reported on 10/22/2019)    . lisinopril (PRINIVIL,ZESTRIL) 10 MG tablet Take 10 mg by mouth daily.     . potassium chloride (KLOR-CON) 20 MEQ packet Take 20 mEq by mouth daily.  (Patient not taking: Reported on 10/22/2019)    . sucralfate (CARAFATE) 1  g tablet Take 1 g by mouth 4 (four) times daily -  with meals and at bedtime. (Patient not taking: Reported on 10/22/2019)     No current facility-administered medications for this visit.    Review of Systems  Constitutional: Positive for weight loss (9 lbs). Negative for chills, diaphoresis, fever and malaise/fatigue.       Feels "excellent."  HENT: Negative.  Negative for congestion, ear discharge, ear pain, hearing loss, nosebleeds, sinus pain, sore throat and tinnitus.   Eyes: Negative for blurred vision, double vision and photophobia.       Occasional yellow eyes.  Respiratory: Negative.  Negative for cough, hemoptysis, sputum production and shortness of breath (resolved).   Cardiovascular: Negative.  Negative for chest pain, palpitations, orthopnea, leg swelling and PND.       Leaky heart valve.  Gastrointestinal: Negative.  Negative for abdominal pain, blood in stool, constipation, diarrhea, heartburn (on Protonix), melena, nausea and vomiting.       Colonoscopy 2 years ago; EGD 10/06/2017. Appetite slightly decreased.  Genitourinary: Negative for dysuria, frequency, hematuria and urgency.       Dark urine in the morning.  Musculoskeletal: Negative for back pain, joint pain, myalgias and neck pain.       Lymphedema in right leg  Skin: Negative.  Negative for itching and rash.  Neurological: Negative.  Negative for dizziness, tingling, sensory change, speech change, focal weakness, weakness and headaches.  Endo/Heme/Allergies: Negative.  Does not bruise/bleed easily.  Psychiatric/Behavioral: Negative for depression, memory loss and suicidal ideas. The patient is not nervous/anxious and does not have insomnia.   All other systems reviewed and are negative.  Performance status (ECOG):  0  Vitals Blood pressure (!) 153/66, pulse 72, temperature (!) 96.6 F (35.9 C), temperature source Tympanic, resp. rate 18, weight 160 lb 15 oz (73 kg), SpO2 100 %.   Physical Exam Vitals and  nursing note reviewed.  Constitutional:      General: He is not in acute distress.    Appearance: He is well-developed. He is not diaphoretic.  HENT:     Head: Normocephalic and atraumatic.     Comments: Short gray hair.    Mouth/Throat:     Mouth: Mucous membranes are moist.     Pharynx: Oropharynx is clear.  Eyes:     General: Scleral icterus (mild) present.     Extraocular Movements: Extraocular movements intact.     Pupils: Pupils are equal, round, and reactive to light.     Comments: Glasses.  Blue eyes.  Cardiovascular:     Rate and Rhythm: Normal rate and regular rhythm.     Heart sounds: Normal heart sounds. No murmur heard.   Pulmonary:     Effort: Pulmonary effort is normal. No respiratory distress.     Breath sounds: Normal breath sounds. No wheezing or rales.  Chest:  Chest wall: No tenderness.  Abdominal:     General: Bowel sounds are normal. There is no distension.     Palpations: Abdomen is soft. There is no mass.     Tenderness: There is no abdominal tenderness. There is no guarding or rebound.  Musculoskeletal:        General: No tenderness. Normal range of motion.     Cervical back: Normal range of motion and neck supple.     Right lower leg: Edema (chronic, R>L) present.     Left lower leg: Edema (chronic) present.  Lymphadenopathy:     Head:     Right side of head: No preauricular, posterior auricular or occipital adenopathy.     Left side of head: No preauricular, posterior auricular or occipital adenopathy.     Cervical: No cervical adenopathy.     Upper Body:     Right upper body: No supraclavicular or axillary adenopathy.     Left upper body: No supraclavicular or axillary adenopathy.     Lower Body: No right inguinal adenopathy. No left inguinal adenopathy.  Skin:    General: Skin is warm and dry.  Neurological:     Mental Status: He is alert and oriented to person, place, and time.  Psychiatric:        Behavior: Behavior normal.         Thought Content: Thought content normal.        Judgment: Judgment normal.    Orders Only on 10/22/2019  Component Date Value Ref Range Status  . Bilirubin, Direct 10/22/2019 0.2  0.0 - 0.2 mg/dL Final   Performed at [facility], [address] [place], [place] [address]  . Retic Ct Pct 10/22/2019 8.3* 0.4 - 3.1 % Final  . RBC. 10/22/2019 2.49* 4.22 - 5.81 MIL/uL Final  . Retic Count, Absolute 10/22/2019 206.4* 19.0 - 186.0 K/uL Final  . Immature Retic Fract 10/22/2019 28.2* 2.3 - 15.9 % Final   Performed at [facility], [address] [place], [place] [address]  . Polyspecific AHG test 10/22/2019    Final                   Value:NEG Performed at [facility], [address] [place], [place] [address]   . Folate 10/22/2019 6.9  >5.9 ng/mL Final   Performed at [facility], [address] [place], [place] [address]  . Vitamin B-12 10/22/2019 456  180 - 914 pg/mL Final   Comment: (NOTE) This assay is not validated for testing neonatal or myeloproliferative syndrome specimens for Vitamin B12 levels. Performed at [facility], [place] [address], [place], [place] [address]   Appointment on 10/22/2019  Component Date Value Ref Range Status  . Ferritin 10/22/2019 135  24 - 336 ng/mL Final   Performed at [facility], [address] [place], [place] [address]  . Iron 10/22/2019 194* 45 - 182 ug/dL Final  . TIBC 10/22/2019 237* 250 - 450 ug/dL Final  . Saturation Ratios 10/22/2019 82* 17.9 - 39.5 % Final  . UIBC 10/22/2019 43  ug/dL Final   Performed at [facility], [address] [place],  [address]  . LDH 10/22/2019 175  98 - 192 U/L Final   Performed at [facility], [address] [place],  [address]  . Sodium 10/22/2019 138  135 - 145 mmol/L Final  . Potassium 10/22/2019 4.2  3.5 - 5.1 mmol/L Final  .  Chloride 10/22/2019 105  98 - 111 mmol/L Final  . CO2 10/22/2019 25  22 - 32  mmol/L Final  . Glucose, Bld 10/22/2019 111* 70 - 99 mg/dL Final   Glucose reference range applies only to samples taken after fasting for at least 8 hours.  . BUN 10/22/2019 19  8 - 23 mg/dL Final  . Creatinine, Ser 10/22/2019 0.78  0.61 - 1.24 mg/dL Final  . Calcium 10/22/2019 8.9  8.9 - 10.3 mg/dL Final  . Total Protein 10/22/2019 6.3* 6.5 - 8.1 g/dL Final  . Albumin 10/22/2019 4.2  3.5 - 5.0 g/dL Final  . AST 10/22/2019 15  15 - 41 U/L Final  . ALT 10/22/2019 12  0 - 44 U/L Final  . Alkaline Phosphatase 10/22/2019 37* 38 - 126 U/L Final  . Total Bilirubin 10/22/2019 3.5* 0.3 - 1.2 mg/dL Final  . GFR calc non Af Amer 10/22/2019 >60  >60 mL/min Final  . GFR calc Af Amer 10/22/2019 >60  >60 mL/min Final  . Anion gap 10/22/2019 8  5 - 15 Final   Performed at [facility], [address] [place], [place] [address]  . WBC 10/22/2019 3.0* 4.0 - 10.5 K/uL Final  . RBC 10/22/2019 2.56* 4.22 - 5.81 MIL/uL Final  . Hemoglobin 10/22/2019 12.0* 13.0 - 17.0 g/dL Final  . HCT 10/22/2019 32.0* 39 - 52 % Final  . MCV 10/22/2019 125.0* 80.0 - 100.0 fL Final  . MCH 10/22/2019 46.9* 26.0 - 34.0 pg Final  . MCHC 10/22/2019 37.5* 30.0 - 36.0 g/dL Final  . RDW 10/22/2019 16.4* 11.5 - 15.5 % Final  . Platelets 10/22/2019 177  150 - 400 K/uL Final  . nRBC 10/22/2019 1.0* 0.0 - 0.2 % Final  . Neutrophils Relative % 10/22/2019 69  % Final  . Neutro Abs 10/22/2019 2.1  1.7 - 7.7 K/uL Final  . Lymphocytes Relative 10/22/2019 18  % Final  . Lymphs Abs 10/22/2019 0.5* 0.7 - 4.0 K/uL Final  . Monocytes Relative 10/22/2019 10  % Final  . Monocytes Absolute 10/22/2019 0.3  0 - 1 K/uL Final  . Eosinophils Relative 10/22/2019 1  % Final  . Eosinophils Absolute 10/22/2019 0.0  0 - 0 K/uL Final  . Basophils Relative 10/22/2019 1  % Final  . Basophils Absolute 10/22/2019 0.0  0 - 0 K/uL Final  . Immature Granulocytes 10/22/2019 1  % Final  . Abs Immature Granulocytes 10/22/2019 0.02  0.00 - 0.07 K/uL  Final   Performed at [facility], [address], [place], [place] [address]    Assessment:  Drew Gardner is a 74 y.o. male with essential thrombocytosison hydroxyurea (began 08/2014). CALR mutation was +.  Platelet count was 1,028,000 on 07/01/2014. Bone marrow biopsyin 08/2015revealed a myeloproliferative disorder. Lumbar spine MRIon 10/04/2013 revealed extensive marrow signal abnormality worrisome for a marrow infiltrative process.  He is on hydroxyurea1000 mg 5 days/week and 500 mg 2 days/week (Mondays and Fridays).  He has B12 deficiencyand pernicious anemia.  Intrinsic factor antibody was positive in 08/2019.  He is on oral B12. B12 was 894 on 07/24/2014, 801 on 04/09/2015, and 887 on 04/23/2019.  Folate was 10.1 on 04/23/2019.  He has evidence of hemolysis.  Haptoglobin was <10 on 04/23/2019.  Total bilirubin was 3.2 (direct 0.2). Rect was 10.3%.  LDH was 195 (98-192). Coombs was negative.  Peripheral smear revealed macrocytosis with circulating nRBCs. There were large platelets. There was no evidence of circulating blasts.  He has hemolysis of unclear etiology.  He was jaundiced at birth.  He was on folic acid years ago when he was younger.  At times, he noticed his urine was dark.  He underwent cholecystectomy for gallstones at age 50.  He is unaware of a family history of any blood disorders.  PNH by flow cytometry was negative on 10/29/2012.  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  He initially underwent frequent phlebotomy, but has not in some time.  Hemochromatosis assay on 05/30/2013 revealed a single mutation (H63D).  Abdominal ultrasound on 05/08/2019 revealed 3 known cysts, the largest contains a septation and measures 5.3 x 3.1 x 3.3 cm in the right lobe (previously 3.0 x 2.5 x 3.1 cm). A smaller septated cyst in the right lobe measures 1.3 x 1.2 x 1.2 cm (previously 1.5 x 1.2 x 1.4 cm), while a cyst in the left lobe measures 2.2 x 1.7 x 1.5 cm  (previously 1.9 x 1.2 x 1.9 cm). There was persistent mild splenomegaly.  There was a right pleural effusion.  AFP was 2.2on 05/03/2019.  Ferritin has been followed: 248 on 10/25/2012, 251 on 01/17/2013, 400 on 04/11/2013, 373 on 07/24/2014, 313 on 04/09/2015, 316 on 04/24/2018 and 172 on 04/23/2019.  He has a history of testicular cancers/p orchiectomy and radiation in 1977. He has Gilbert's disease.  He has a history ofmalignant melanomaon the lower back s/p biopsy on 03/05/2018 followed by wide excision on 03/16/2018. Clinical stagewas T1bN0Mx (stage IA). Lesion was 0.8 mm thick. He is followed at [facility].  He has Barrett's esophagus.  He received the COVID-19 vaccine (second dose on 05/09/2019).  Symptomatically, he feels "pretty good".  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  Exam reveals mild icterus.  Bilirubin is 3.5 (direct 0.2).  Plan: 1.   Labs today:  CBC with diff, CMP, LDH, ferritin, iron studies, retic, Coombs, folate. 2.   Essential thrombocytosis CALR mutation is positive.  Clinically, he appears to be doing well. Hematocrit 37.7.  Hemoglobin 14.1.  MCV 113.9.  Platelets 330,000 on 05/03/2019.  Hematocrit 36.8.  Hemoglobin 13.4.  MCV 118.7.  Platelets 285,000 on 08/26/2019.  Hematocrit 32.0.  Hemoglobin 12.0.  MCV 125.0.  Platelets 177,000 on 10/22/2019.    Patient on hydroxyurea 1000 mg 5 days/week and 500 mg 2 days/week (Mondays and Fridays). Macrocytosis is felt secondary to hydroxyurea.  Discuss concern for progressive anemia.  Patient denies any bleeding.  Patient has a history of hemolysis (see below).  Consider repeat bone marrow (last 2015).  Consider evaluation at [facility] for second opinion. 3.B12 deficiency He has pernicious anemia. Intrinsic factor antibody was positive in 08/2019.   He is on oral B12. B12 and folate are normal today. 4.   Hemolysis  Patient has a  normal hematocrit/hemoglobin but elevated retic, elevated indirect bilirubin, and slightly elevated LDH.  Peripheral smear revealed macrocytosis with circulating nRBCs. There were large platelets. There was no evidence of circulating blasts.   He has had 3 CBCs with an interfering substance precluding measurement of hemogloblin.  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  Review of prior labs:    LDH normal dating back to 2014 until 04/23/2019.   Bilirubin 2.4-4.1 (direct 0.1-0.2).   Retic 5.4%-10.3% last 2 years.   PNH testing negative on 10/29/2012.   Coombs and cold agglutinins were negative on 04/23/2019.  G6PD and pyruvate kinase were negative on 05/03/2019.   Unclear family history.  No known hemoglobinopathy  or RBC membrane defect.  He is s/p cholecystectomy at a young age.  Patient notes one new cardiac medication.   Lisinopril has 1-10% incidence of hemolytic anemia.  He has aortic insufficiency - sheer across valves.  There are rare instances of hydroxyurea induced hemolysis   "Hydroxyurea induced hemolytic anemia in a patient with essential thromboctytopenia" in [place] of Hematology 2004, 77: [phone].  Consider evaluation at [facility] for second opinion. 5.   Hemochromatosis  Reviewed past history and frequent phlebotomies (none in years).  Ferritin has ranged between 300 - 400.  Ferritin 135 with an iron saturation of 82% and a TIBC of 237 on 10/22/2019.   Unusual without phlebotomy, good diet and no bleeding.   Patient notes colonoscopy 2 years ago (no report available) negative.   EGD 10/06/2017 revealed Barrett's without evidence of GI blood loss.   Urinalysis on 04/23/2019 revealed no hematuria.  Hemochromatosis testing confirmed patient is a carrier (single mutation H63D).   No need for phlebotomies.  AFP was normal  Abdominal ultrasound revealed multiple cysts. 6.   No phlebotomy today. 7.   RTC in 1  week for labs (CBC +/- others). 8.   RTC in 1 month for MD assessment and labs (CBC with diff, CMP, direct bilirubin).  I discussed the assessment and treatment plan with the patient.  The patient was provided an opportunity to ask questions and all were answered.  The patient agreed with the plan and demonstrated an understanding of the instructions.  The patient was advised to call back if the symptoms worsen or if the condition fails to improve as anticipated.  I provided 21 minutes of face-to-face time during this this encounter and > 50% was spent counseling as documented under my assessment and plan. An additional 5-10 minutes were spent reviewing his chart (Epic and Care Everywhere) including notes, labs, and imaging studies.    Lequita Asal, MD, PhD    10/22/2019, 1:11 PM  I, Mirian Mo Tufford, am acting as a Education administrator for [employer]. Mike Gip, MD.   I, Anjolaoluwa Siguenza C. Mike Gip, MD, have reviewed the above documentation for accuracy and completeness, and I agree with the above.

## 2019-10-22 ENCOUNTER — Encounter: Payer: Self-pay | Admitting: Hematology and Oncology

## 2019-10-22 ENCOUNTER — Other Ambulatory Visit: Payer: Self-pay

## 2019-10-22 ENCOUNTER — Other Ambulatory Visit: Payer: Self-pay | Admitting: Hematology and Oncology

## 2019-10-22 ENCOUNTER — Ambulatory Visit: Payer: Medicare Other | Admitting: Hematology and Oncology

## 2019-10-22 ENCOUNTER — Inpatient Hospital Stay (HOSPITAL_BASED_OUTPATIENT_CLINIC_OR_DEPARTMENT_OTHER): Payer: Medicare Other | Admitting: Hematology and Oncology

## 2019-10-22 ENCOUNTER — Inpatient Hospital Stay: Payer: Medicare Other | Attending: Hematology and Oncology

## 2019-10-22 VITALS — BP 153/66 | HR 72 | Temp 96.6°F | Resp 18 | Wt 160.9 lb

## 2019-10-22 DIAGNOSIS — E538 Deficiency of other specified B group vitamins: Secondary | ICD-10-CM | POA: Diagnosis not present

## 2019-10-22 DIAGNOSIS — R17 Unspecified jaundice: Secondary | ICD-10-CM | POA: Diagnosis not present

## 2019-10-22 DIAGNOSIS — D598 Other acquired hemolytic anemias: Secondary | ICD-10-CM

## 2019-10-22 DIAGNOSIS — Z923 Personal history of irradiation: Secondary | ICD-10-CM | POA: Diagnosis not present

## 2019-10-22 DIAGNOSIS — Z8547 Personal history of malignant neoplasm of testis: Secondary | ICD-10-CM | POA: Diagnosis not present

## 2019-10-22 DIAGNOSIS — D510 Vitamin B12 deficiency anemia due to intrinsic factor deficiency: Secondary | ICD-10-CM | POA: Insufficient documentation

## 2019-10-22 DIAGNOSIS — D539 Nutritional anemia, unspecified: Secondary | ICD-10-CM

## 2019-10-22 DIAGNOSIS — I10 Essential (primary) hypertension: Secondary | ICD-10-CM | POA: Insufficient documentation

## 2019-10-22 DIAGNOSIS — D473 Essential (hemorrhagic) thrombocythemia: Secondary | ICD-10-CM | POA: Diagnosis present

## 2019-10-22 DIAGNOSIS — Z79899 Other long term (current) drug therapy: Secondary | ICD-10-CM | POA: Insufficient documentation

## 2019-10-22 LAB — RETICULOCYTES
Immature Retic Fract: 28.2 % — ABNORMAL HIGH (ref 2.3–15.9)
RBC.: 2.49 MIL/uL — ABNORMAL LOW (ref 4.22–5.81)
Retic Count, Absolute: 206.4 10*3/uL — ABNORMAL HIGH (ref 19.0–186.0)
Retic Ct Pct: 8.3 % — ABNORMAL HIGH (ref 0.4–3.1)

## 2019-10-22 LAB — COMPREHENSIVE METABOLIC PANEL
ALT: 12 U/L (ref 0–44)
AST: 15 U/L (ref 15–41)
Albumin: 4.2 g/dL (ref 3.5–5.0)
Alkaline Phosphatase: 37 U/L — ABNORMAL LOW (ref 38–126)
Anion gap: 8 (ref 5–15)
BUN: 19 mg/dL (ref 8–23)
CO2: 25 mmol/L (ref 22–32)
Calcium: 8.9 mg/dL (ref 8.9–10.3)
Chloride: 105 mmol/L (ref 98–111)
Creatinine, Ser: 0.78 mg/dL (ref 0.61–1.24)
GFR calc Af Amer: >60 mL/min (ref >60)
GFR calc non Af Amer: >60 mL/min (ref >60)
Glucose, Bld: 111 mg/dL — ABNORMAL HIGH (ref 70–99)
Potassium: 4.2 mmol/L (ref 3.5–5.1)
Sodium: 138 mmol/L (ref 135–145)
Total Bilirubin: 3.5 mg/dL — ABNORMAL HIGH (ref 0.3–1.2)
Total Protein: 6.3 g/dL — ABNORMAL LOW (ref 6.5–8.1)

## 2019-10-22 LAB — VITAMIN B12: Vitamin B-12: 456 pg/mL (ref 180–914)

## 2019-10-22 LAB — CBC WITH DIFFERENTIAL/PLATELET
Abs Immature Granulocytes: 0.02 10*3/uL (ref 0.00–0.07)
Basophils Absolute: 0 10*3/uL (ref 0.0–0.1)
Basophils Relative: 1 %
Eosinophils Absolute: 0 10*3/uL (ref 0.0–0.5)
Eosinophils Relative: 1 %
HCT: 32 % — ABNORMAL LOW (ref 39.0–52.0)
Hemoglobin: 12 g/dL — ABNORMAL LOW (ref 13.0–17.0)
Immature Granulocytes: 1 %
Lymphocytes Relative: 18 %
Lymphs Abs: 0.5 10*3/uL — ABNORMAL LOW (ref 0.7–4.0)
MCH: 46.9 pg — ABNORMAL HIGH (ref 26.0–34.0)
MCHC: 37.5 g/dL — ABNORMAL HIGH (ref 30.0–36.0)
MCV: 125 fL — ABNORMAL HIGH (ref 80.0–100.0)
Monocytes Absolute: 0.3 10*3/uL (ref 0.1–1.0)
Monocytes Relative: 10 %
Neutro Abs: 2.1 10*3/uL (ref 1.7–7.7)
Neutrophils Relative %: 69 %
Platelets: 177 10*3/uL (ref 150–400)
RBC: 2.56 MIL/uL — ABNORMAL LOW (ref 4.22–5.81)
RDW: 16.4 % — ABNORMAL HIGH (ref 11.5–15.5)
WBC: 3 10*3/uL — ABNORMAL LOW (ref 4.0–10.5)
nRBC: 1 % — ABNORMAL HIGH (ref 0.0–0.2)

## 2019-10-22 LAB — FERRITIN: Ferritin: 135 ng/mL (ref 24–336)

## 2019-10-22 LAB — IRON AND TIBC
Iron: 194 ug/dL — ABNORMAL HIGH (ref 45–182)
Saturation Ratios: 82 % — ABNORMAL HIGH (ref 17.9–39.5)
TIBC: 237 ug/dL — ABNORMAL LOW (ref 250–450)
UIBC: 43 ug/dL

## 2019-10-22 LAB — DAT, POLYSPECIFIC AHG (ARMC ONLY): Polyspecific AHG test: NEGATIVE

## 2019-10-22 LAB — BILIRUBIN, DIRECT: Bilirubin, Direct: 0.2 mg/dL (ref 0.0–0.2)

## 2019-10-22 LAB — LACTATE DEHYDROGENASE: LDH: 175 U/L (ref 98–192)

## 2019-10-22 LAB — FOLATE: Folate: 6.9 ng/mL (ref >5.9)

## 2019-10-29 ENCOUNTER — Other Ambulatory Visit: Payer: Self-pay

## 2019-10-29 ENCOUNTER — Inpatient Hospital Stay: Payer: Medicare Other

## 2019-10-29 DIAGNOSIS — E538 Deficiency of other specified B group vitamins: Secondary | ICD-10-CM

## 2019-10-29 DIAGNOSIS — D473 Essential (hemorrhagic) thrombocythemia: Secondary | ICD-10-CM

## 2019-10-29 DIAGNOSIS — D539 Nutritional anemia, unspecified: Secondary | ICD-10-CM

## 2019-10-29 DIAGNOSIS — R17 Unspecified jaundice: Secondary | ICD-10-CM

## 2019-10-29 DIAGNOSIS — D598 Other acquired hemolytic anemias: Secondary | ICD-10-CM

## 2019-10-29 LAB — RETICULOCYTES
Immature Retic Fract: 27.7 % — ABNORMAL HIGH (ref 2.3–15.9)
RBC.: 2.57 MIL/uL — ABNORMAL LOW (ref 4.22–5.81)
Retic Count, Absolute: 229.2 10*3/uL — ABNORMAL HIGH (ref 19.0–186.0)
Retic Ct Pct: 8.9 % — ABNORMAL HIGH (ref 0.4–3.1)

## 2019-10-29 LAB — CBC WITH DIFFERENTIAL/PLATELET
Abs Immature Granulocytes: 0 10*3/uL (ref 0.00–0.07)
Basophils Absolute: 0 10*3/uL (ref 0.0–0.1)
Basophils Relative: 0 %
Eosinophils Absolute: 0 10*3/uL (ref 0.0–0.5)
Eosinophils Relative: 0 %
HCT: 33.3 % — ABNORMAL LOW (ref 39.0–52.0)
Hemoglobin: 12.2 g/dL — ABNORMAL LOW (ref 13.0–17.0)
Lymphocytes Relative: 15 %
Lymphs Abs: 0.5 10*3/uL — ABNORMAL LOW (ref 0.7–4.0)
MCH: 47.5 pg — ABNORMAL HIGH (ref 26.0–34.0)
MCHC: 36.6 g/dL — ABNORMAL HIGH (ref 30.0–36.0)
MCV: 129.6 fL — ABNORMAL HIGH (ref 80.0–100.0)
Monocytes Absolute: 0.3 10*3/uL (ref 0.1–1.0)
Monocytes Relative: 9 %
Neutro Abs: 2.6 10*3/uL (ref 1.7–7.7)
Neutrophils Relative %: 76 %
Platelets: 150 10*3/uL (ref 150–400)
RBC Morphology: NONE SEEN
RBC: 2.57 MIL/uL — ABNORMAL LOW (ref 4.22–5.81)
RDW: 16.1 % — ABNORMAL HIGH (ref 11.5–15.5)
Smear Review: ADEQUATE
WBC: 3.6 10*3/uL — ABNORMAL LOW (ref 4.0–10.5)
nRBC: 0 % (ref 0.0–0.2)
nRBC: 2 /100 WBC — ABNORMAL HIGH

## 2019-11-03 DIAGNOSIS — R17 Unspecified jaundice: Secondary | ICD-10-CM | POA: Insufficient documentation

## 2019-11-03 DIAGNOSIS — D539 Nutritional anemia, unspecified: Secondary | ICD-10-CM | POA: Insufficient documentation

## 2019-11-18 NOTE — Progress Notes (Signed)
[facility]  [address], [address] [place], [place] [address] Phone: [phone]  Fax: [phone]   Clinic Day: 11/19/2019   Referring physician: Barbaraann Boys, MD  Chief Complaint: Drew Gardner is a 74 y.o. 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   HPI: The patient was last seen in the hematology clinic on 10/22/2019. At that time, he felt "pretty good".  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  Exam reveals mild icterus. Hematocrit was 32.0, hemoglobin 12.0, MCV 125.0, platelets 177,000, WBC 3,000 (ANC 2,100). Alkaline phosphatase was 37. Bilirubin was 3.5 (direct 0.2). Ferritin was 135 with an iron saturation of 43% and a TIBC of 237. Reticulocyte count was 8.3%. Folate was 6.9 and Vitamin B12 was 456. Coombs was negative. LDH was 175.  Echo on 10/22/2019 revealed an EF of >55%.  Labs on 10/29/2019 revealed a hematocrit of 33.3, hemoglobin 12.2, MCV 129.6, platelets 150,000, WBC 3,600. Reticulocyte count was 8.9%.  During the interim, he has been "pretty good." He has on and off shortness of breath. He states that he has gained some fluid weight and when he is over 160 lbs, he gets short of breath. He has chronic lymphedema in his right leg and takes a diuretic daily. He states that he has some fluid around his lungs, but not enough for them to drain it. All of his other symptoms are unchanged.  His eyes have been jaundiced for as long as he can remember. He thinks his mother told him he was born jaundiced. He has had dark urine in the morning since his 46s.  The patient used to take folic acid but does not anymore.   Past Medical History:  Diagnosis Date  . Anemia   . Arthritis   . Barrett's esophagus   . Dependent edema    chronic  . Essential thrombocytosis ([place])   . Flow murmur   . GERD (gastroesophageal reflux disease)   . Glaucoma   . Hemochromatosis   . Hypertension   .  Testicular cancer [facility])    when patient ws 74 years old    Past Surgical History:  Procedure Laterality Date  . CHOLECYSTECTOMY    . ESOPHAGOGASTRODUODENOSCOPY (EGD) WITH PROPOFOL N/A 10/06/2017   Procedure: ESOPHAGOGASTRODUODENOSCOPY (EGD) WITH PROPOFOL;  Surgeon: Manya Silvas, MD;  Location: [facility] ENDOSCOPY;  Service: Endoscopy;  Laterality: N/A;  . ORCHIECTOMY Right    when he was 74 years old    Family History  Problem Relation Age of Onset  . Stroke Mother   . Stroke Father     Social History:  reports that he has never smoked. He has never used smokeless tobacco. He reports current alcohol use of about 1.0 standard drink of alcohol per week. He reports that he does not use drugs. He has no family history of any blood disorders. The patient lives in [place]. The patient is alone today.   Allergies: No Known Allergies  Current Medications: Current Outpatient Medications  Medication Sig Dispense Refill  . amLODipine (NORVASC) 5 MG tablet Take 5 mg by mouth daily.    Marland Kitchen aspirin 81 MG tablet Take 81 mg by mouth daily.     . brimonidine-timolol (COMBIGAN) 0.2-0.5 % ophthalmic solution Apply 0.2 drops to eye daily. Both eyes    . furosemide (LASIX) 20 MG tablet Take 1 tablet by mouth daily.    . hydroxyurea (HYDREA) 500 MG capsule TAKE 1 CAPSULE BY MOUTH  MONDAY AND FRIDAY AND 2 CAPSULES DAILY THE REST OF THE WEEK 144 capsule 1  . lisinopril (PRINIVIL,ZESTRIL) 10 MG tablet Take 10 mg by mouth daily.     . meloxicam (MOBIC) 15 MG tablet daily as needed.     . pantoprazole (PROTONIX) 40 MG tablet Take 40 mg by mouth daily.    . vitamin B-12 (CYANOCOBALAMIN) 1000 MCG tablet Take 1,000 mcg by mouth daily.    . potassium chloride (KLOR-CON) 20 MEQ packet Take 20 mEq by mouth daily.  (Patient not taking: Reported on 10/22/2019)    . sucralfate (CARAFATE) 1 g tablet Take 1 g by mouth 4 (four) times daily -  with meals and at bedtime. (Patient not taking: Reported on 10/22/2019)     No  current facility-administered medications for this visit.    Review of Systems  Constitutional: Negative for chills, diaphoresis, fever, malaise/fatigue and weight loss (up 4 lbs).       Feels "pretty good."  HENT: Negative.  Negative for congestion, ear discharge, ear pain, hearing loss, nosebleeds, sinus pain, sore throat and tinnitus.   Eyes: Negative for blurred vision, double vision and photophobia.       Occasional yellow eyes.  Respiratory: Positive for shortness of breath (on and off). Negative for cough, hemoptysis and sputum production.   Cardiovascular: Negative.  Negative for chest pain, palpitations, orthopnea, leg swelling and PND.       Leaky heart valve.  Gastrointestinal: Negative.  Negative for abdominal pain, blood in stool, constipation, diarrhea, heartburn (on Protonix), melena, nausea and vomiting.       Colonoscopy 2 years ago; EGD 10/06/2017.  Genitourinary: Negative for dysuria, frequency, hematuria and urgency.       Dark urine in the morning.  Musculoskeletal: Negative for back pain, joint pain, myalgias and neck pain.       Lymphedema in right leg  Skin: Negative.  Negative for itching and rash.  Neurological: Negative.  Negative for dizziness, tingling, sensory change, speech change, focal weakness, weakness and headaches.  Endo/Heme/Allergies: Negative.  Does not bruise/bleed easily.  Psychiatric/Behavioral: Negative for depression, memory loss and suicidal ideas. The patient is not nervous/anxious and does not have insomnia.   All other systems reviewed and are negative.  Performance status (ECOG):  0  Vitals Blood pressure (!) 161/92, pulse 76, temperature (!) 96.3 F (35.7 C), temperature source Tympanic, resp. rate 18, weight 164 lb 14.5 oz (74.8 kg), SpO2 98 %.   Physical Exam Vitals and nursing note reviewed.  Constitutional:      General: He is not in acute distress.    Appearance: He is well-developed. He is not diaphoretic.  HENT:     Head:  Normocephalic and atraumatic.     Comments: Short gray hair.    Mouth/Throat:     Mouth: Mucous membranes are moist.     Pharynx: Oropharynx is clear.  Eyes:     General: Scleral icterus (mild) present.     Extraocular Movements: Extraocular movements intact.     Pupils: Pupils are equal, round, and reactive to light.     Comments: Glasses.  Blue eyes.  Cardiovascular:     Rate and Rhythm: Normal rate and regular rhythm.     Heart sounds: Normal heart sounds. No murmur heard.   Pulmonary:     Effort: Pulmonary effort is normal. No respiratory distress.     Breath sounds: Normal breath sounds. No wheezing or rales.  Chest:     Chest wall:  No tenderness.  Abdominal:     General: Bowel sounds are normal. There is no distension.     Palpations: Abdomen is soft. There is splenomegaly. There is no hepatomegaly or mass.     Tenderness: There is no abdominal tenderness. There is no guarding or rebound.  Musculoskeletal:        General: No swelling or tenderness. Normal range of motion.     Cervical back: Normal range of motion and neck supple.  Lymphadenopathy:     Head:     Right side of head: No preauricular, posterior auricular or occipital adenopathy.     Left side of head: No preauricular, posterior auricular or occipital adenopathy.     Cervical: No cervical adenopathy.     Upper Body:     Right upper body: No supraclavicular or axillary adenopathy.     Left upper body: No supraclavicular or axillary adenopathy.     Lower Body: No right inguinal adenopathy. No left inguinal adenopathy.  Skin:    General: Skin is warm and dry.  Neurological:     Mental Status: He is alert and oriented to person, place, and time.  Psychiatric:        Behavior: Behavior normal.        Thought Content: Thought content normal.        Judgment: Judgment normal.    Appointment on 11/19/2019  Component Date Value Ref Range Status  . Bilirubin, Direct 11/19/2019 0.2  0.0 - 0.2 mg/dL Final    Performed at [facility], [address] [place], [place] [address]  . Sodium 11/19/2019 137  135 - 145 mmol/L Final  . Potassium 11/19/2019 4.2  3.5 - 5.1 mmol/L Final  . Chloride 11/19/2019 104  98 - 111 mmol/L Final  . CO2 11/19/2019 26  22 - 32 mmol/L Final  . Glucose, Bld 11/19/2019 89  70 - 99 mg/dL Final   Glucose reference range applies only to samples taken after fasting for at least 8 hours.  . BUN 11/19/2019 18  8 - 23 mg/dL Final  . Creatinine, Ser 11/19/2019 0.71  0.61 - 1.24 mg/dL Final  . Calcium 11/19/2019 9.3  8.9 - 10.3 mg/dL Final  . Total Protein 11/19/2019 6.8  6.5 - 8.1 g/dL Final  . Albumin 11/19/2019 4.5  3.5 - 5.0 g/dL Final  . AST 11/19/2019 15  15 - 41 U/L Final  . ALT 11/19/2019 11  0 - 44 U/L Final  . Alkaline Phosphatase 11/19/2019 39  38 - 126 U/L Final  . Total Bilirubin 11/19/2019 4.3* 0.3 - 1.2 mg/dL Final  . GFR calc non Af Amer 11/19/2019 >60  >60 mL/min Final  . GFR calc Af Amer 11/19/2019 >60  >60 mL/min Final  . Anion gap 11/19/2019 7  5 - 15 Final   Performed at [facility], [address] [place], [place] [address]  . WBC 11/19/2019 3.5* 4.0 - 10.5 K/uL Final  . RBC 11/19/2019 2.90* 4.22 - 5.81 MIL/uL Final  . Hemoglobin 11/19/2019 13.6  13.0 - 17.0 g/dL Final  . HCT 11/19/2019 36.4* 39 - 52 % Final  . MCV 11/19/2019 125.5* 80.0 - 100.0 fL Final  . MCH 11/19/2019 46.9* 26.0 - 34.0 pg Final  . MCHC 11/19/2019 37.4* 30.0 - 36.0 g/dL Final  . RDW 11/19/2019 15.2  11.5 - 15.5 % Final  . Platelets 11/19/2019 257  150 - 400 K/uL Final  . nRBC 11/19/2019 0.0  0.0 -  0.2 % Final  . Neutrophils Relative % 11/19/2019 71  % Final  . Neutro Abs 11/19/2019 2.5  1.7 - 7.7 K/uL Final  . Lymphocytes Relative 11/19/2019 15  % Final  . Lymphs Abs 11/19/2019 0.5* 0.7 - 4.0 K/uL Final  . Monocytes Relative 11/19/2019 11  % Final  . Monocytes Absolute 11/19/2019 0.4  0 - 1 K/uL Final  . Eosinophils Relative 11/19/2019 1  % Final  .  Eosinophils Absolute 11/19/2019 0.0  0 - 0 K/uL Final  . Basophils Relative 11/19/2019 1  % Final  . Basophils Absolute 11/19/2019 0.0  0 - 0 K/uL Final  . Immature Granulocytes 11/19/2019 1  % Final  . Abs Immature Granulocytes 11/19/2019 0.02  0.00 - 0.07 K/uL Final   Performed at [facility], [address], [place], [place] [address]    Assessment:  TRAY KLAYMAN is a 74 y.o. male with essential thrombocytosison hydroxyurea (began 08/2014). CALR mutation was +.  Platelet count was 1,028,000 on 07/01/2014. Bone marrow biopsyin 08/2015revealed a myeloproliferative disorder. Lumbar spine MRIon 10/04/2013 revealed extensive marrow signal abnormality worrisome for a marrow infiltrative process.  He is on hydroxyurea1000 mg 5 days/week and 500 mg 2 days/week (Mondays and Fridays).  He has B12 deficiencyand pernicious anemia.  Intrinsic factor antibody was positive in 08/2019.  He is on oral B12. B12 was 894 on 07/24/2014, 801 on 04/09/2015, and 887 on 04/23/2019.  Folate was 10.1 on 04/23/2019 and 6.9 on 10/22/2019.  He has evidence of hemolysis.  Haptoglobin was <10 on 04/23/2019.  Total bilirubin was 3.2 (direct 0.2). Rect was 10.3%.  LDH was 195 (98-192). Coombs was negative.  Peripheral smear revealed macrocytosis with circulating nRBCs. There were large platelets. There was no evidence of circulating blasts.   He has hemolysis of unclear etiology.  He was jaundiced at birth.  He was on folic acid years ago when he was younger.  At times, he noticed his urine was dark.  He underwent cholecystectomy for gallstones at age 16.  He is unaware of a family history of any blood disorders.  PNH by flow cytometry was negative on 10/29/2012.  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  He initially underwent frequent phlebotomy, but has not in some time.  Hemochromatosis assay on 05/30/2013 revealed a single mutation (H63D).  Abdominal ultrasound on 05/08/2019 revealed 3 known  cysts, the largest contains a septation and measures 5.3 x 3.1 x 3.3 cm in the right lobe (previously 3.0 x 2.5 x 3.1 cm). A smaller septated cyst in the right lobe measures 1.3 x 1.2 x 1.2 cm (previously 1.5 x 1.2 x 1.4 cm), while a cyst in the left lobe measures 2.2 x 1.7 x 1.5 cm (previously 1.9 x 1.2 x 1.9 cm). There was persistent mild splenomegaly.  There was a right pleural effusion.  AFP was 2.2on 05/03/2019.  Ferritin has been followed: 248 on 10/25/2012, 251 on 01/17/2013, 400 on 04/11/2013, 373 on 07/24/2014, 313 on 04/09/2015, 316 on 04/24/2018 and 172 on 04/23/2019.  He has a history of testicular cancers/p orchiectomy and radiation in 1977. He has Gilbert's disease.  He has a history ofmalignant melanomaon the lower back s/p biopsy on 03/05/2018 followed by wide excision on 03/16/2018. Clinical stagewas T1bN0Mx (stage IA). Lesion was 0.8 mm thick. He is followed at [facility].  He has Barrett's esophagus.  He received the COVID-19 vaccine (second dose on 05/09/2019).  Symptomatically, he feels "pretty good". He has on and off shortness  of breath. He notes along standing history of jaundice. He has had dark urine in the morning since his 22s.  Exam is stable.  Bilirubin is 4.3 (direct 0.2).  Plan: 1.   Labs today:  CBC with diff, CMP, direct bilirubin, folate. 2.   Essential thrombocytosis CALR mutation is positive.  Clinically, he is stable.  Hematocrit 36.8.  Hemoglobin 13.4.  MCV 118.7.  Platelets 285,000 on 08/26/2019.  Hematocrit 32.0.  Hemoglobin 12.0.  MCV 125.0.  Platelets 177,000 on 10/22/2019.  Hematocrit 33.3.  Hemoglobin 12.2.  MCV 129.6.  Platelets 150,000 on 10/29/2019.   Hematocrit 36.4.  Hemoglobin 13.6.  MCV 125.5.  Platelets 257,000 on 11/19/2019.  He remains on hydroxyurea 1000 mg 5 days/week and 500 mg 2 days/week (Mondays and Fridays). Macrocytosis is felt secondary to hydroxyurea.  Last bone marrow was performed in  10/2013.  Discuss consideration of consultation with [facility] regarding management. 3.B12 deficiency He has pernicious anemia. Intrinsic factor antibody was positive in 08/2019.   Remains on oral B12. B12 was 456 on 10/22/2019.    Folate 10.1 on 11/19/2019.  Discuss importance of folate supplementation secondary to chronic hemolysis. 4.   Hemolysis  Patient has mild anemia but elevated retic, elevated indirect bilirubin, and slightly elevated LDH.  Peripheral smear revealed macrocytosis with circulating nRBCs. There were large platelets. There was no evidence of circulating blasts.   He has had 3 CBCs with an interfering substance precluding measurement of hemogloblin.  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  Review of prior labs:    LDH normal dating back to 2014 until 04/23/2019.   Bilirubin 2.4-4.1 (direct 0.1-0.2).   Retic 5.4%-10.3% last 2 years.   PNH testing negative on 10/29/2012.   Coombs and cold agglutinins were negative on 04/23/2019.  G6PD and pyruvate kinase were negative on 05/03/2019.   Unclear family history.  No known hemoglobinopathy or RBC membrane defect.  He is s/p cholecystectomy at a young age.   He has had dark urine in the mornings in his early 25s.  He notes chronic jaundice.  He has aortic insufficiency - sheer across valves.  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   Hemolysis appears to have preceded initiation of hydroxyurea.  Readdress consideration of evaluation at [facility] for second opinion.  Patient in agreement. 5.   Hemochromatosis (carrier)  Reviewed past history and frequent phlebotomies (none in years).  Ferritin has ranged between 300 - 400.  Ferritin was 135 with an iron saturation of  82% and a TIBC of 237 on 10/22/2019.   Unusual without phlebotomy, good diet and no bleeding.   Patient notes colonoscopy 2 years ago (no report available) negative.   EGD 10/06/2017 revealed Barrett's without evidence of GI blood loss.   Urinalysis on 04/23/2019 revealed no hematuria.  Hemochromatosis testing confirmed patient is a carrier (single mutation H63D).   No need for phlebotomies.  AFP was normal on 05/03/2019..  Abdominal ultrasound on 05/08/2019 revealed enlargement of 2 hepatic cyst in parentheses largest 5.3 cm) and stable splenomegaly. 6.   MD to call patient after [facility] consultation. 7.   RTC in 3 months for MD assessment, labs (CBC with diff, CMP, retic, LDH, +/- others).  I discussed the assessment and treatment plan with the patient.  The patient was provided an opportunity to ask questions and all  were answered.  The patient agreed with the plan and demonstrated an understanding of the instructions.  The patient was advised to call back if the symptoms worsen or if the condition fails to improve as anticipated.  I provided 15 minutes of face-to-face time during this this encounter and > 50% was spent counseling as documented under my assessment and plan.    Lequita Asal, MD, PhD    11/19/2019, 10:38 AM   I, Mirian Mo Tufford, am acting as a Education administrator for [employer]. Mike Gip, MD.   I, Melissa C. Mike Gip, MD, have reviewed the above documentation for accuracy and completeness, and I agree with the above.

## 2019-11-19 ENCOUNTER — Other Ambulatory Visit: Payer: Self-pay

## 2019-11-19 ENCOUNTER — Other Ambulatory Visit: Payer: Self-pay | Admitting: Hematology and Oncology

## 2019-11-19 ENCOUNTER — Inpatient Hospital Stay: Payer: Medicare Other | Attending: Hematology and Oncology | Admitting: Hematology and Oncology

## 2019-11-19 ENCOUNTER — Encounter: Payer: Self-pay | Admitting: Hematology and Oncology

## 2019-11-19 ENCOUNTER — Inpatient Hospital Stay: Payer: Medicare Other

## 2019-11-19 ENCOUNTER — Telehealth: Payer: Self-pay

## 2019-11-19 VITALS — BP 161/92 | HR 76 | Temp 96.3°F | Resp 18 | Wt 164.9 lb

## 2019-11-19 DIAGNOSIS — D539 Nutritional anemia, unspecified: Secondary | ICD-10-CM

## 2019-11-19 DIAGNOSIS — D473 Essential (hemorrhagic) thrombocythemia: Secondary | ICD-10-CM | POA: Diagnosis present

## 2019-11-19 DIAGNOSIS — D510 Vitamin B12 deficiency anemia due to intrinsic factor deficiency: Secondary | ICD-10-CM | POA: Insufficient documentation

## 2019-11-19 DIAGNOSIS — Z923 Personal history of irradiation: Secondary | ICD-10-CM | POA: Insufficient documentation

## 2019-11-19 DIAGNOSIS — D598 Other acquired hemolytic anemias: Secondary | ICD-10-CM | POA: Diagnosis not present

## 2019-11-19 DIAGNOSIS — E538 Deficiency of other specified B group vitamins: Secondary | ICD-10-CM

## 2019-11-19 DIAGNOSIS — Z7189 Other specified counseling: Secondary | ICD-10-CM

## 2019-11-19 DIAGNOSIS — I890 Lymphedema, not elsewhere classified: Secondary | ICD-10-CM | POA: Insufficient documentation

## 2019-11-19 DIAGNOSIS — D589 Hereditary hemolytic anemia, unspecified: Secondary | ICD-10-CM | POA: Diagnosis not present

## 2019-11-19 DIAGNOSIS — R17 Unspecified jaundice: Secondary | ICD-10-CM

## 2019-11-19 DIAGNOSIS — Z8547 Personal history of malignant neoplasm of testis: Secondary | ICD-10-CM | POA: Diagnosis not present

## 2019-11-19 DIAGNOSIS — Z9049 Acquired absence of other specified parts of digestive tract: Secondary | ICD-10-CM | POA: Insufficient documentation

## 2019-11-19 DIAGNOSIS — Z79899 Other long term (current) drug therapy: Secondary | ICD-10-CM | POA: Diagnosis not present

## 2019-11-19 DIAGNOSIS — I10 Essential (primary) hypertension: Secondary | ICD-10-CM | POA: Insufficient documentation

## 2019-11-19 DIAGNOSIS — R779 Abnormality of plasma protein, unspecified: Secondary | ICD-10-CM | POA: Diagnosis not present

## 2019-11-19 LAB — COMPREHENSIVE METABOLIC PANEL
ALT: 11 U/L (ref 0–44)
AST: 15 U/L (ref 15–41)
Albumin: 4.5 g/dL (ref 3.5–5.0)
Alkaline Phosphatase: 39 U/L (ref 38–126)
Anion gap: 7 (ref 5–15)
BUN: 18 mg/dL (ref 8–23)
CO2: 26 mmol/L (ref 22–32)
Calcium: 9.3 mg/dL (ref 8.9–10.3)
Chloride: 104 mmol/L (ref 98–111)
Creatinine, Ser: 0.71 mg/dL (ref 0.61–1.24)
GFR calc Af Amer: >60 mL/min (ref >60)
GFR calc non Af Amer: >60 mL/min (ref >60)
Glucose, Bld: 89 mg/dL (ref 70–99)
Potassium: 4.2 mmol/L (ref 3.5–5.1)
Sodium: 137 mmol/L (ref 135–145)
Total Bilirubin: 4.3 mg/dL — ABNORMAL HIGH (ref 0.3–1.2)
Total Protein: 6.8 g/dL (ref 6.5–8.1)

## 2019-11-19 LAB — CBC WITH DIFFERENTIAL/PLATELET
Abs Immature Granulocytes: 0.02 10*3/uL (ref 0.00–0.07)
Basophils Absolute: 0 10*3/uL (ref 0.0–0.1)
Basophils Relative: 1 %
Eosinophils Absolute: 0 10*3/uL (ref 0.0–0.5)
Eosinophils Relative: 1 %
HCT: 36.4 % — ABNORMAL LOW (ref 39.0–52.0)
Hemoglobin: 13.6 g/dL (ref 13.0–17.0)
Immature Granulocytes: 1 %
Lymphocytes Relative: 15 %
Lymphs Abs: 0.5 10*3/uL — ABNORMAL LOW (ref 0.7–4.0)
MCH: 46.9 pg — ABNORMAL HIGH (ref 26.0–34.0)
MCHC: 37.4 g/dL — ABNORMAL HIGH (ref 30.0–36.0)
MCV: 125.5 fL — ABNORMAL HIGH (ref 80.0–100.0)
Monocytes Absolute: 0.4 10*3/uL (ref 0.1–1.0)
Monocytes Relative: 11 %
Neutro Abs: 2.5 10*3/uL (ref 1.7–7.7)
Neutrophils Relative %: 71 %
Platelets: 257 10*3/uL (ref 150–400)
RBC: 2.9 MIL/uL — ABNORMAL LOW (ref 4.22–5.81)
RDW: 15.2 % (ref 11.5–15.5)
WBC: 3.5 10*3/uL — ABNORMAL LOW (ref 4.0–10.5)
nRBC: 0 % (ref 0.0–0.2)

## 2019-11-19 LAB — BILIRUBIN, DIRECT: Bilirubin, Direct: 0.2 mg/dL (ref 0.0–0.2)

## 2019-11-19 LAB — FOLATE: Folate: 10.1 ng/mL (ref >5.9)

## 2019-11-19 NOTE — Telephone Encounter (Signed)
-----   Message from Lequita Asal, MD sent at 11/19/2019  4:19 PM EDT ----- Regarding: Please call patient  Folic acid level is great.  M ----- Message ----- From: Buel Ream, Lab In [place] Sent: 11/19/2019   9:35 AM EDT To: Lequita Asal, MD

## 2019-11-19 NOTE — Telephone Encounter (Signed)
Spoke with the patient to inform him, Per Dr Mike Gip his Folic acid levels are great. The patient was understanding and agreeable.

## 2019-11-19 NOTE — Progress Notes (Signed)
Patient here for oncology follow-up appointment, expresses no complaints or concerns at this time.    

## 2020-01-21 ENCOUNTER — Other Ambulatory Visit: Payer: Self-pay | Admitting: Hematology and Oncology

## 2020-01-21 DIAGNOSIS — D473 Essential (hemorrhagic) thrombocythemia: Secondary | ICD-10-CM

## 2020-02-13 ENCOUNTER — Other Ambulatory Visit: Payer: Self-pay

## 2020-02-13 DIAGNOSIS — R17 Unspecified jaundice: Secondary | ICD-10-CM

## 2020-02-13 DIAGNOSIS — R779 Abnormality of plasma protein, unspecified: Secondary | ICD-10-CM

## 2020-02-13 DIAGNOSIS — D473 Essential (hemorrhagic) thrombocythemia: Secondary | ICD-10-CM

## 2020-02-13 DIAGNOSIS — E538 Deficiency of other specified B group vitamins: Secondary | ICD-10-CM

## 2020-02-13 DIAGNOSIS — D598 Other acquired hemolytic anemias: Secondary | ICD-10-CM

## 2020-02-13 DIAGNOSIS — D539 Nutritional anemia, unspecified: Secondary | ICD-10-CM

## 2020-02-13 DIAGNOSIS — R778 Other specified abnormalities of plasma proteins: Secondary | ICD-10-CM

## 2020-02-17 NOTE — Progress Notes (Signed)
[facility]  [address] [place], [place] [address] Phone: [phone]  Fax: [phone]   Clinic Day: 02/18/2020   Referring physician: Barbaraann Boys, MD  Chief Complaint: Drew Gardner is a 74 y.o. 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   HPI: The patient was last seen in the hematology clinic on 11/19/2019. At that time, he felt "pretty good". He had on and off shortness of breath. He described a long standing history of jaundice. He reported having dark urine in the morning since his 55s.  Exam was stable. Hematocrit was 36.4, hemoglobin 13.6, platelets 257,000, WBC 3,500. Bilirubin was 4.3 (direct 0.2). Folate was 10.1. He continued oral B12.  The patient saw Dr. Ubaldo Glassing on 12/17/2019 for follow-up of essential hypertension, moderate aortic insufficiency, mitral regurgitation, and dependent edema.  A low sodium diet was stressed.  He was to continue amlodipine and Lasix (daily and prn if weight approaches 160 pounds).  During the interim, he has been good. He has leg swelling and increased his diuretic dose. In the past week his weight has gone from 163 to 157. His shortness of breath improves when his weight goes down. He has had a cough for the past week.  His urine has been lighter yellow than usual; it has not been orange. He is following a low salt diet. He denies any change in his stool color. He does not notice any yellowing of his eyes because he has had this all his life.  The patient has been taking folic acid since his last visit but is unsure of the dose. He takes hydroxyurea 500 mg BID.  He denies any family history of hemolysis.   Past Medical History:  Diagnosis Date  . Anemia   . Arthritis   . Barrett's esophagus   . Dependent edema    chronic  . Essential thrombocytosis ([place])   . Flow murmur   . GERD (gastroesophageal reflux disease)   . Glaucoma   . Hemochromatosis   .  Hypertension   . Testicular cancer [facility])    when patient ws 74 years old    Past Surgical History:  Procedure Laterality Date  . CHOLECYSTECTOMY    . ESOPHAGOGASTRODUODENOSCOPY (EGD) WITH PROPOFOL N/A 10/06/2017   Procedure: ESOPHAGOGASTRODUODENOSCOPY (EGD) WITH PROPOFOL;  Surgeon: Manya Silvas, MD;  Location: [facility] ENDOSCOPY;  Service: Endoscopy;  Laterality: N/A;  . ORCHIECTOMY Right    when he was 74 years old    Family History  Problem Relation Age of Onset  . Stroke Mother   . Stroke Father     Social History:  reports that he has never smoked. He has never used smokeless tobacco. He reports current alcohol use of about 1.0 standard drink of alcohol per week. He reports that he does not use drugs. He has no family history of any blood disorders. The patient lives in [place]. The patient is alone today.   Allergies: No Known Allergies  Current Medications: Current Outpatient Medications  Medication Sig Dispense Refill  . amLODipine (NORVASC) 5 MG tablet Take 5 mg by mouth daily.    Marland Kitchen aspirin 81 MG tablet Take 81 mg by mouth daily.     . brimonidine-timolol (COMBIGAN) 0.2-0.5 % ophthalmic solution Apply 0.2 drops to eye daily. Both eyes    . chlorthalidone (HYGROTON) 25 MG tablet Take by mouth.    . folic acid (FOLVITE) 1 MG tablet Take by mouth.    Marland Kitchen  furosemide (LASIX) 20 MG tablet Take 1 tablet by mouth daily.    . hydroxyurea (HYDREA) 500 MG capsule TAKE 1 CAPSULE BY MOUTH MONDAY AND FRIDAY AND 2 CAPSULES DAILY THE REST OF THE WEEK 144 capsule 1  . lisinopril (PRINIVIL,ZESTRIL) 10 MG tablet Take 10 mg by mouth daily.     . meloxicam (MOBIC) 15 MG tablet daily as needed.     . pantoprazole (PROTONIX) 40 MG tablet Take 40 mg by mouth daily.    . vitamin B-12 (CYANOCOBALAMIN) 1000 MCG tablet Take 1,000 mcg by mouth daily.    . potassium chloride (KLOR-CON) 20 MEQ packet Take 20 mEq by mouth daily.  (Patient not taking: No sig reported)    . sucralfate (CARAFATE) 1 g tablet  Take 1 g by mouth 4 (four) times daily -  with meals and at bedtime. (Patient not taking: No sig reported)     No current facility-administered medications for this visit.    Review of Systems  Constitutional: Positive for weight loss (4 lbs). Negative for chills, diaphoresis, fever and malaise/fatigue.       Feels good.  HENT: Negative.  Negative for congestion, ear discharge, ear pain, hearing loss, nosebleeds, sinus pain, sore throat and tinnitus.   Eyes: Negative for blurred vision, double vision and photophobia.  Respiratory: Positive for cough (x 1 week) and shortness of breath (improved). Negative for hemoptysis and sputum production.   Cardiovascular: Positive for leg swelling. Negative for chest pain, palpitations, orthopnea and PND.       Leaky heart valve.  Gastrointestinal: Negative for abdominal pain, blood in stool, constipation, diarrhea, heartburn (on Protonix), melena, nausea and vomiting.  Genitourinary: Negative for dysuria, frequency, hematuria and urgency.       Dark urine in the morning, improved  Musculoskeletal: Negative for back pain, joint pain, myalgias and neck pain.       Lymphedema in right leg  Skin: Negative.  Negative for itching and rash.  Neurological: Negative.  Negative for dizziness, tingling, sensory change, speech change, focal weakness, weakness and headaches.  Endo/Heme/Allergies: Negative.  Does not bruise/bleed easily.  Psychiatric/Behavioral: Negative.  Negative for depression, memory loss and suicidal ideas. The patient is not nervous/anxious and does not have insomnia.   All other systems reviewed and are negative.  Performance status (ECOG):  0-1  Vitals Blood pressure (!) 176/55, pulse 74, temperature (!) 96.4 F (35.8 C), temperature source Tympanic, resp. rate 16, weight 160 lb 2.6 oz (72.7 kg), SpO2 100 %.   Physical Exam Vitals and nursing note reviewed.  Constitutional:      General: He is not in acute distress.    Appearance: He  is well-developed. He is not diaphoretic.  HENT:     Head: Normocephalic and atraumatic.     Comments: Short gray hair.    Mouth/Throat:     Mouth: Mucous membranes are moist.     Pharynx: Oropharynx is clear.  Eyes:     General: Scleral icterus (mild) present.     Extraocular Movements: Extraocular movements intact.     Pupils: Pupils are equal, round, and reactive to light.     Comments: Glasses.  Blue eyes.  Cardiovascular:     Rate and Rhythm: Normal rate and regular rhythm.     Heart sounds: Normal heart sounds. No murmur heard.   Pulmonary:     Effort: Pulmonary effort is normal. No respiratory distress.     Breath sounds: Normal breath sounds. No wheezing or rales.  Chest:     Chest wall: No tenderness.  Breasts:     Right: No axillary adenopathy or supraclavicular adenopathy.     Left: No axillary adenopathy or supraclavicular adenopathy.    Abdominal:     General: Bowel sounds are normal. There is no distension.     Palpations: Abdomen is soft. There is splenomegaly (palpable 2 fingers breadths below the left costal margin). There is no hepatomegaly or mass.     Tenderness: There is no abdominal tenderness. There is no guarding or rebound.  Musculoskeletal:        General: No swelling or tenderness. Normal range of motion.     Cervical back: Normal range of motion and neck supple.     Right lower leg: Edema (chronic changes) present.  Lymphadenopathy:     Head:     Right side of head: No preauricular, posterior auricular or occipital adenopathy.     Left side of head: No preauricular, posterior auricular or occipital adenopathy.     Cervical: No cervical adenopathy.     Upper Body:     Right upper body: No supraclavicular or axillary adenopathy.     Left upper body: No supraclavicular or axillary adenopathy.     Lower Body: No right inguinal adenopathy. No left inguinal adenopathy.  Skin:    General: Skin is warm and dry.  Neurological:     Mental Status: He is  alert and oriented to person, place, and time.  Psychiatric:        Behavior: Behavior normal.        Thought Content: Thought content normal.        Judgment: Judgment normal.    Appointment on 02/18/2020  Component Date Value Ref Range Status  . LDH 02/18/2020 178  98 - 192 U/L Final   Performed at [facility], [address], [place], [place] [address]  . Sodium 02/18/2020 137  135 - 145 mmol/L Final  . Potassium 02/18/2020 3.8  3.5 - 5.1 mmol/L Final  . Chloride 02/18/2020 103  98 - 111 mmol/L Final  . CO2 02/18/2020 27  22 - 32 mmol/L Final  . Glucose, Bld 02/18/2020 107* 70 - 99 mg/dL Final   Glucose reference range applies only to samples taken after fasting for at least 8 hours.  . BUN 02/18/2020 23  8 - 23 mg/dL Final  . Creatinine, Ser 02/18/2020 0.68  0.61 - 1.24 mg/dL Final  . Calcium 02/18/2020 9.1  8.9 - 10.3 mg/dL Final  . Total Protein 02/18/2020 6.8  6.5 - 8.1 g/dL Final  . Albumin 02/18/2020 4.6  3.5 - 5.0 g/dL Final  . AST 02/18/2020 16  15 - 41 U/L Final  . ALT 02/18/2020 16  0 - 44 U/L Final  . Alkaline Phosphatase 02/18/2020 43  38 - 126 U/L Final  . Total Bilirubin 02/18/2020 3.9* 0.3 - 1.2 mg/dL Final  . GFR, Estimated 02/18/2020 >60  >60 mL/min Final   Comment: (NOTE) Calculated using the CKD-EPI Creatinine Equation (2021)   . Anion gap 02/18/2020 7  5 - 15 Final   Performed at [facility], [address] [place], [place] [address]  . WBC 02/18/2020 3.7* 4.0 - 10.5 K/uL Final  . RBC 02/18/2020 3.12* 4.22 - 5.81 MIL/uL Final  . Hemoglobin 02/18/2020 13.7  13.0 - 17.0 g/dL Final  . HCT 02/18/2020 36.9* 39.0 - 52.0 % Final  . MCV 02/18/2020 118.3* 80.0 - 100.0 fL Final  .  MCH 02/18/2020 43.9* 26.0 - 34.0 pg Final  . MCHC 02/18/2020 37.1* 30.0 - 36.0 g/dL Final  . RDW 02/18/2020 15.5  11.5 - 15.5 % Final  . Platelets 02/18/2020 301  150 - 400 K/uL Final  . nRBC 02/18/2020 0.0  0.0 - 0.2 % Final  . Neutrophils Relative %  02/18/2020 76  % Final  . Neutro Abs 02/18/2020 2.8  1.7 - 7.7 K/uL Final  . Lymphocytes Relative 02/18/2020 14  % Final  . Lymphs Abs 02/18/2020 0.5* 0.7 - 4.0 K/uL Final  . Monocytes Relative 02/18/2020 8  % Final  . Monocytes Absolute 02/18/2020 0.3  0.1 - 1.0 K/uL Final  . Eosinophils Relative 02/18/2020 1  % Final  . Eosinophils Absolute 02/18/2020 0.1  0.0 - 0.5 K/uL Final  . Basophils Relative 02/18/2020 1  % Final  . Basophils Absolute 02/18/2020 0.1  0.0 - 0.1 K/uL Final  . Immature Granulocytes 02/18/2020 0  % Final  . Abs Immature Granulocytes 02/18/2020 0.01  0.00 - 0.07 K/uL Final   Performed at [facility], [address] [place], [place] [address]    Assessment:  Drew Gardner is a 74 y.o. male with essential thrombocytosison hydroxyurea (began 08/2014). CALR mutation was +.  Platelet count was 1,028,000 on 07/01/2014. Bone marrow biopsyin 08/2015revealed a myeloproliferative disorder. Lumbar spine MRIon 10/04/2013 revealed extensive marrow signal abnormality worrisome for a marrow infiltrative process.  He is on hydroxyurea1000 mg 5 days/week and 500 mg 2 days/week (Mondays and Fridays).  He has B12 deficiencyand pernicious anemia.  Intrinsic factor antibody was positive in 08/2019.  He is on oral B12. B12 was 894 on 07/24/2014, 801 on 04/09/2015, and 887 on 04/23/2019.  Folate was 10.1 on 04/23/2019 and 6.9 on 10/22/2019.  He has evidence of hemolysis.  Haptoglobin was <10 on 04/23/2019.  Total bilirubin was 3.2 (direct 0.2). Rect was 10.3%.  LDH was 195 (98-192). Coombs was negative.  Peripheral smear revealed macrocytosis with circulating nRBCs. There were large platelets. There was no evidence of circulating blasts.   He has hemolysis of unclear etiology.  He was jaundiced at birth.  He was on folic acid years ago when he was younger.  At times, he noticed his urine was dark.  He underwent cholecystectomy for gallstones at age 1.  He is  unaware of a family history of any blood disorders.  PNH by flow cytometry was negative on 10/29/2012.  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  He initially underwent frequent phlebotomy, but has not in some time.  Hemochromatosis assay on 05/30/2013 revealed a single mutation (H63D).  Abdominal ultrasound on 05/08/2019 revealed 3 known cysts, the largest contains a septation and measures 5.3 x 3.1 x 3.3 cm in the right lobe (previously 3.0 x 2.5 x 3.1 cm). A smaller septated cyst in the right lobe measures 1.3 x 1.2 x 1.2 cm (previously 1.5 x 1.2 x 1.4 cm), while a cyst in the left lobe measures 2.2 x 1.7 x 1.5 cm (previously 1.9 x 1.2 x 1.9 cm). There was persistent mild splenomegaly.  There was a right pleural effusion.  AFP was 2.2on 05/03/2019.  Ferritin has been followed: 248 on 10/25/2012, 251 on 01/17/2013, 400 on 04/11/2013, 373 on 07/24/2014, 313 on 04/09/2015, 316 on 04/24/2018 and 172 on 04/23/2019.  He has a history of testicular cancers/p orchiectomy and radiation in 1977. He has Gilbert's disease.  He has a history ofmalignant melanomaon the lower back s/p biopsy on 03/05/2018  followed by wide excision on 03/16/2018. Clinical stagewas T1bN0Mx (stage IA). Lesion was 0.8 mm thick. He is followed at [facility]  He has Barrett's esophagus.  He received the COVID-19 vaccine (second dose on 05/09/2019). He received the [employer] on 12/11/2019.  Symptomatically, he feels good. His shortness of breath improves when his weight goes down. His urine has been lighter yellow than usual.  He denies any change in his stool color. Exam is stable.  Plan: 1.   Labs today:  CBC with diff, CMP, retic, LDH, folate, hemoglobin electrophoresis, osmotic fragility. 2.   Essential thrombocytosis CALR mutation is positive.  Clinically, he is stable.  Hematocrit 33.3.  Hemoglobin 12.2.  MCV 129.6.  Platelets 150,000 on 10/29/2019.   Hematocrit 36.4.  Hemoglobin 13.6.  MCV 125.5.   Platelets 257,000 on 11/19/2019.  Hematocrit 36.9.  Hemoglobin 13.7.  MCV 118.3.  Platelets 301,000 on 02/18/2020.  Continue hydroxyurea 1000 mg 5 days/week and 500 mg 2 days/week (Mondays and Fridays). Macrocytosis is secondary to hydroxyurea  Last bone marrow was performed in 10/2013.  Continue to monitor. 3.B12 deficiency He has pernicious anemia. Intrinsic factor antibody was positive in 08/2019.   He remains on oral B12. B12 was 456 on 10/22/2019.    Folate 10.1 on 11/19/2019.  Patient to continue folate acid supplementation secondary to chronic hemolysis. 4.   Hemolysis  Patient has mild anemia but elevated retic, elevated indirect bilirubin, and slightly elevated LDH.  Peripheral smear revealed macrocytosis with circulating nRBCs. There were large platelets. There was no evidence of circulating blasts.   He has had 3 CBCs with an interfering substance precluding measurement of hemogloblin.  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  Review of prior labs:    LDH normal dating back to 2014 until 04/23/2019 (195).   Bilirubin 2.4-4.1 (direct 0.1-0.2).   Retic 5.4%-10.3% last 2 years.   PNH testing negative on 10/29/2012.   Coombs was negative on 04/23/2019 and 10/22/2019.   Cold agglutinins were negative on 04/23/2019.  G6PD and pyruvate kinase were negative on 05/03/2019.   Patient denies any family history of a known hemoglobinopathy or RBC membrane defect.    He is s/p cholecystectomy at a young age.   He has had dark urine in the mornings in his early 28s.  He notes chronic jaundice.  He has aortic insufficiency - sheer across valves.  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   Hemolysis appears to have  preceded initiation of hydroxyurea.  He is interested in a second opinion at [facility] to determine the etiology of his hemolysis. 5.   Hemochromatosis (carrier)  He has a history of frequent phlebotomies (none in years).  Ferritin has ranged between 300 - 400.  Ferritin was 135 with an iron saturation of 82% and a TIBC of 237 on 10/22/2019.   Patient notes colonoscopy 2 years ago (no report available) negative.   EGD 10/06/2017 revealed Barrett's without evidence of GI blood loss.   Urinalysis on 04/23/2019 revealed no hematuria.  Hemochromatosis testing confirmed patient is a carrier (single mutation H63D).   No need for phlebotomies.  AFP was normal on 05/03/2019.  Abdominal ultrasound on 05/08/2019 revealed enlargement of 2 hepatic cysts (largest 5.3 cm) and stable splenomegaly. 6.   RTC in 3 months for MD assessment, labs (CBC with diff, CMP, retic,  LDH).  I discussed the assessment and treatment plan with the patient.  The patient was provided an opportunity to ask questions and all were answered.  The patient agreed with the plan and demonstrated an understanding of the instructions.  The patient was advised to call back if the symptoms worsen or if the condition fails to improve as anticipated.  I provided 10 minutes of face-to-face time during this this encounter and > 50% was spent counseling as documented under my assessment and plan.   Lequita Asal, MD, PhD    02/18/2020, 9:56 AM   I, Mirian Mo Tufford, am acting as a Education administrator for [employer]. Mike Gip, MD.   I, Zhana Jeangilles C. Mike Gip, MD, have reviewed the above documentation for accuracy and completeness, and I agree with the above.

## 2020-02-18 ENCOUNTER — Other Ambulatory Visit: Payer: Self-pay | Admitting: Hematology and Oncology

## 2020-02-18 ENCOUNTER — Encounter: Payer: Self-pay | Admitting: Hematology and Oncology

## 2020-02-18 ENCOUNTER — Other Ambulatory Visit: Payer: Self-pay

## 2020-02-18 ENCOUNTER — Inpatient Hospital Stay: Payer: Medicare Other | Attending: Hematology and Oncology | Admitting: Hematology and Oncology

## 2020-02-18 ENCOUNTER — Inpatient Hospital Stay: Payer: Medicare Other

## 2020-02-18 VITALS — BP 176/55 | HR 74 | Temp 96.4°F | Resp 16 | Wt 160.2 lb

## 2020-02-18 DIAGNOSIS — Z923 Personal history of irradiation: Secondary | ICD-10-CM | POA: Diagnosis not present

## 2020-02-18 DIAGNOSIS — D473 Essential (hemorrhagic) thrombocythemia: Secondary | ICD-10-CM | POA: Diagnosis present

## 2020-02-18 DIAGNOSIS — D539 Nutritional anemia, unspecified: Secondary | ICD-10-CM

## 2020-02-18 DIAGNOSIS — R779 Abnormality of plasma protein, unspecified: Secondary | ICD-10-CM

## 2020-02-18 DIAGNOSIS — E538 Deficiency of other specified B group vitamins: Secondary | ICD-10-CM

## 2020-02-18 DIAGNOSIS — Z8547 Personal history of malignant neoplasm of testis: Secondary | ICD-10-CM | POA: Diagnosis not present

## 2020-02-18 DIAGNOSIS — Z79899 Other long term (current) drug therapy: Secondary | ICD-10-CM | POA: Diagnosis not present

## 2020-02-18 DIAGNOSIS — I10 Essential (primary) hypertension: Secondary | ICD-10-CM | POA: Insufficient documentation

## 2020-02-18 DIAGNOSIS — D7589 Other specified diseases of blood and blood-forming organs: Secondary | ICD-10-CM | POA: Diagnosis not present

## 2020-02-18 DIAGNOSIS — R17 Unspecified jaundice: Secondary | ICD-10-CM

## 2020-02-18 DIAGNOSIS — D598 Other acquired hemolytic anemias: Secondary | ICD-10-CM

## 2020-02-18 DIAGNOSIS — R778 Other specified abnormalities of plasma proteins: Secondary | ICD-10-CM

## 2020-02-18 DIAGNOSIS — D510 Vitamin B12 deficiency anemia due to intrinsic factor deficiency: Secondary | ICD-10-CM | POA: Diagnosis not present

## 2020-02-18 LAB — CBC WITH DIFFERENTIAL/PLATELET
Abs Immature Granulocytes: 0.01 10*3/uL (ref 0.00–0.07)
Basophils Absolute: 0.1 10*3/uL (ref 0.0–0.1)
Basophils Relative: 1 %
Eosinophils Absolute: 0.1 10*3/uL (ref 0.0–0.5)
Eosinophils Relative: 1 %
HCT: 36.9 % — ABNORMAL LOW (ref 39.0–52.0)
Hemoglobin: 13.7 g/dL (ref 13.0–17.0)
Immature Granulocytes: 0 %
Lymphocytes Relative: 14 %
Lymphs Abs: 0.5 10*3/uL — ABNORMAL LOW (ref 0.7–4.0)
MCH: 43.9 pg — ABNORMAL HIGH (ref 26.0–34.0)
MCHC: 37.1 g/dL — ABNORMAL HIGH (ref 30.0–36.0)
MCV: 118.3 fL — ABNORMAL HIGH (ref 80.0–100.0)
Monocytes Absolute: 0.3 10*3/uL (ref 0.1–1.0)
Monocytes Relative: 8 %
Neutro Abs: 2.8 10*3/uL (ref 1.7–7.7)
Neutrophils Relative %: 76 %
Platelets: 301 10*3/uL (ref 150–400)
RBC: 3.12 MIL/uL — ABNORMAL LOW (ref 4.22–5.81)
RDW: 15.5 % (ref 11.5–15.5)
WBC: 3.7 10*3/uL — ABNORMAL LOW (ref 4.0–10.5)
nRBC: 0 % (ref 0.0–0.2)

## 2020-02-18 LAB — COMPREHENSIVE METABOLIC PANEL
ALT: 16 U/L (ref 0–44)
AST: 16 U/L (ref 15–41)
Albumin: 4.6 g/dL (ref 3.5–5.0)
Alkaline Phosphatase: 43 U/L (ref 38–126)
Anion gap: 7 (ref 5–15)
BUN: 23 mg/dL (ref 8–23)
CO2: 27 mmol/L (ref 22–32)
Calcium: 9.1 mg/dL (ref 8.9–10.3)
Chloride: 103 mmol/L (ref 98–111)
Creatinine, Ser: 0.68 mg/dL (ref 0.61–1.24)
GFR, Estimated: >60 mL/min (ref >60)
Glucose, Bld: 107 mg/dL — ABNORMAL HIGH (ref 70–99)
Potassium: 3.8 mmol/L (ref 3.5–5.1)
Sodium: 137 mmol/L (ref 135–145)
Total Bilirubin: 3.9 mg/dL — ABNORMAL HIGH (ref 0.3–1.2)
Total Protein: 6.8 g/dL (ref 6.5–8.1)

## 2020-02-18 LAB — RETICULOCYTES
Immature Retic Fract: 25.3 % — ABNORMAL HIGH (ref 2.3–15.9)
RBC.: 3.09 MIL/uL — ABNORMAL LOW (ref 4.22–5.81)
Retic Count, Absolute: 207.6 10*3/uL — ABNORMAL HIGH (ref 19.0–186.0)
Retic Ct Pct: 6.7 % — ABNORMAL HIGH (ref 0.4–3.1)

## 2020-02-18 LAB — FOLATE: Folate: 19.8 ng/mL (ref >5.9)

## 2020-02-18 LAB — LACTATE DEHYDROGENASE: LDH: 178 U/L (ref 98–192)

## 2020-02-20 LAB — HGB FRACTIONATION CASCADE
Hgb A2: 2.4 % (ref 1.8–3.2)
Hgb A: 88.9 % — ABNORMAL LOW (ref 96.4–98.8)
Hgb F: 8.7 % — ABNORMAL HIGH (ref 0.0–2.0)
Hgb S: 0 %

## 2020-02-25 LAB — RBC OSMOTIC FRAGILITY

## 2020-05-14 NOTE — Progress Notes (Signed)
[facility]  [address] [place], [place] [address] Phone: [phone]  Fax: [phone]   Clinic Day: 05/18/2020   Referring physician: Barbaraann Boys, MD  Chief Complaint: Drew Gardner is a 75 y.o. 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   HPI: The patient was last seen in the hematology clinic on 02/18/2020. At that time, he felt good. His shortness of breath improved when his weight went down. His urine had been lighter yellow than usual.  He denied any change in his stool color. Exam was stable. Hematocrit was 36.9, hemoglobin 13.7, platelets 301,000, WBC 3,700 (ANC 2800).  Bilirubin was 3.9.  Retic was 6.9%.  LDH was 178 (normal).  Hemoglobin electrophoresis revealed Hgb A 88.9 and HgbF 8.7% (high). Folate was 19.8.  He continued folic acid and hydroxyurea.  During the interim, he has been fine. He has been a bit short of breath with exertion. He is up 5 lbs. His coughs in the morning for about an hour. His legs are swollen and he tries keep them elevated at night. His urine is still dark in the mornings.  He takes hydroxyurea 1000 mg 5 days/week and 500 mg 2 days/week. He takes oral J00 9381 mcg and folic acid 1 mg. He takes Protonix and does not have any heartburn.  He has been active lately because he has been packing up his house. He is moving to [place], [place].   Past Medical History:  Diagnosis Date  . Anemia   . Arthritis   . Barrett's esophagus   . Dependent edema    chronic  . Essential thrombocytosis ([place])   . Flow murmur   . GERD (gastroesophageal reflux disease)   . Glaucoma   . Hemochromatosis   . Hypertension   . Testicular cancer [facility])    when patient ws 75 years old    Past Surgical History:  Procedure Laterality Date  . CHOLECYSTECTOMY    . ESOPHAGOGASTRODUODENOSCOPY (EGD) WITH PROPOFOL N/A 10/06/2017   Procedure: ESOPHAGOGASTRODUODENOSCOPY (EGD) WITH PROPOFOL;  Surgeon:  Manya Silvas, MD;  Location: [facility] ENDOSCOPY;  Service: Endoscopy;  Laterality: N/A;  . ORCHIECTOMY Right    when he was 75 years old    Family History  Problem Relation Age of Onset  . Stroke Mother   . Stroke Father     Social History:  reports that he has never smoked. He has never used smokeless tobacco. He reports current alcohol use of about 1.0 standard drink of alcohol per week. He reports that he does not use drugs. He has no family history of any blood disorders. The patient lives in [place]. He is moving to [place], [place]. The patient is alone today.   Allergies: No Known Allergies  Current Medications: Current Outpatient Medications  Medication Sig Dispense Refill  . amLODipine (NORVASC) 5 MG tablet Take 5 mg by mouth daily.    Marland Kitchen aspirin 81 MG tablet Take 81 mg by mouth daily.     . brimonidine-timolol (COMBIGAN) 0.2-0.5 % ophthalmic solution Apply 0.2 drops to eye daily. Both eyes    . folic acid (FOLVITE) 1 MG tablet Take by mouth.    . furosemide (LASIX) 20 MG tablet Take 1 tablet by mouth daily.    Marland Kitchen lisinopril (PRINIVIL,ZESTRIL) 10 MG tablet Take 10 mg by mouth daily.     . meloxicam (MOBIC) 15 MG tablet daily as needed.     . pantoprazole (PROTONIX) 40  MG tablet Take 40 mg by mouth daily.    . vitamin B-12 (CYANOCOBALAMIN) 1000 MCG tablet Take 1,000 mcg by mouth daily.    . chlorthalidone (HYGROTON) 25 MG tablet Take by mouth. (Patient not taking: Reported on 05/18/2020)    . hydroxyurea (HYDREA) 500 MG capsule May take with food to minimize GI side effects. 144 capsule 0  . potassium chloride (KLOR-CON) 20 MEQ packet Take 20 mEq by mouth daily.  (Patient not taking: No sig reported)    . sucralfate (CARAFATE) 1 g tablet Take 1 g by mouth 4 (four) times daily -  with meals and at bedtime. (Patient not taking: No sig reported)     No current facility-administered medications for this visit.    Review of Systems  Constitutional: Negative for chills, diaphoresis,  fever, malaise/fatigue and weight loss (up 5 lbs).  HENT: Negative.  Negative for congestion, ear discharge, ear pain, hearing loss, nosebleeds, sinus pain, sore throat and tinnitus.   Eyes: Negative.  Negative for blurred vision, double vision and photophobia.  Respiratory: Positive for cough (mornings) and shortness of breath. Negative for hemoptysis and sputum production.   Cardiovascular: Positive for leg swelling. Negative for chest pain, palpitations, orthopnea and PND.       Leaky heart valve.  Gastrointestinal: Negative for abdominal pain, blood in stool, constipation, diarrhea, heartburn (on Protonix), melena, nausea and vomiting.  Genitourinary: Negative for dysuria, frequency, hematuria and urgency.       Dark urine in the morning.  Musculoskeletal: Negative for back pain, joint pain, myalgias and neck pain.  Skin: Negative.  Negative for itching and rash.  Neurological: Negative.  Negative for dizziness, tingling, sensory change, speech change, focal weakness, weakness and headaches.  Endo/Heme/Allergies: Negative.  Does not bruise/bleed easily.  Psychiatric/Behavioral: Negative.  Negative for depression, memory loss and suicidal ideas. The patient is not nervous/anxious and does not have insomnia.   All other systems reviewed and are negative.  Performance status (ECOG):  1  Vitals Blood pressure (!) 158/65, pulse 76, temperature (!) 97.2 F (36.2 C), temperature source Tympanic, resp. rate 18, weight 165 lb 0.2 oz (74.9 kg), SpO2 98 %.   Physical Exam Vitals and nursing note reviewed.  Constitutional:      General: He is not in acute distress.    Appearance: He is well-developed. He is not diaphoretic.  HENT:     Head: Normocephalic and atraumatic.     Comments: Short gray hair. Goatee.    Mouth/Throat:     Mouth: Mucous membranes are moist.     Pharynx: Oropharynx is clear.  Eyes:     Extraocular Movements: Extraocular movements intact.     Pupils: Pupils are equal,  round, and reactive to light.     Comments: Glasses.  Blue eyes.  Cardiovascular:     Rate and Rhythm: Normal rate and regular rhythm.     Heart sounds: Normal heart sounds. No murmur heard.   Pulmonary:     Effort: Pulmonary effort is normal. No respiratory distress.     Breath sounds: Normal breath sounds. No wheezing or rales.  Chest:     Chest wall: No tenderness.  Breasts:     Right: No axillary adenopathy or supraclavicular adenopathy.     Left: No axillary adenopathy or supraclavicular adenopathy.    Abdominal:     General: Bowel sounds are normal. There is no distension.     Palpations: Abdomen is soft. There is no hepatomegaly, splenomegaly or mass.  Tenderness: There is no abdominal tenderness. There is no guarding or rebound.  Musculoskeletal:        General: No swelling or tenderness. Normal range of motion.     Cervical back: Normal range of motion and neck supple.     Right lower leg: Edema (R>L, chronic) present.     Left lower leg: Edema present.  Lymphadenopathy:     Head:     Right side of head: No preauricular, posterior auricular or occipital adenopathy.     Left side of head: No preauricular, posterior auricular or occipital adenopathy.     Cervical: No cervical adenopathy.     Upper Body:     Right upper body: No supraclavicular or axillary adenopathy.     Left upper body: No supraclavicular or axillary adenopathy.     Lower Body: No right inguinal adenopathy. No left inguinal adenopathy.  Skin:    General: Skin is warm and dry.     Coloration: Skin is jaundiced (subtle).  Neurological:     Mental Status: He is alert and oriented to person, place, and time.  Psychiatric:        Behavior: Behavior normal.        Thought Content: Thought content normal.        Judgment: Judgment normal.    Appointment on 05/18/2020  Component Date Value Ref Range Status  . LDH 05/18/2020 169  98 - 192 U/L Final   Performed at [facility], [address] [place], [place] [address]  . Sodium 05/18/2020 137  135 - 145 mmol/L Final  . Potassium 05/18/2020 3.9  3.5 - 5.1 mmol/L Final  . Chloride 05/18/2020 100  98 - 111 mmol/L Final  . CO2 05/18/2020 29  22 - 32 mmol/L Final  . Glucose, Bld 05/18/2020 117* 70 - 99 mg/dL Final   Glucose reference range applies only to samples taken after fasting for at least 8 hours.  . BUN 05/18/2020 13  8 - 23 mg/dL Final  . Creatinine, Ser 05/18/2020 0.72  0.61 - 1.24 mg/dL Final  . Calcium 05/18/2020 9.4  8.9 - 10.3 mg/dL Final  . Total Protein 05/18/2020 6.9  6.5 - 8.1 g/dL Final  . Albumin 05/18/2020 4.0  3.5 - 5.0 g/dL Final  . AST 05/18/2020 15  15 - 41 U/L Final  . ALT 05/18/2020 12  0 - 44 U/L Final  . Alkaline Phosphatase 05/18/2020 52  38 - 126 U/L Final  . Total Bilirubin 05/18/2020 3.6* 0.3 - 1.2 mg/dL Final  . GFR, Estimated 05/18/2020 >60  >60 mL/min Final   Comment: (NOTE) Calculated using the CKD-EPI Creatinine Equation (2021)   . Anion gap 05/18/2020 8  5 - 15 Final   Performed at [facility], [address], [place], [place] [address]  . WBC 05/18/2020 6.4  4.0 - 10.5 K/uL Final  . RBC 05/18/2020 2.93* 4.22 - 5.81 MIL/uL Final  . Hemoglobin 05/18/2020 12.4* 13.0 - 17.0 g/dL Final  . HCT 05/18/2020 34.6* 39.0 - 52.0 % Final  . MCV 05/18/2020 118.1* 80.0 - 100.0 fL Final  . MCH 05/18/2020 42.3* 26.0 - 34.0 pg Final  . MCHC 05/18/2020 35.8  30.0 - 36.0 g/dL Final  . RDW 05/18/2020 17.2* 11.5 - 15.5 % Final  . Platelets 05/18/2020 371  150 - 400 K/uL Final  . nRBC 05/18/2020 0.0  0.0 - 0.2 % Final  . Neutrophils Relative % 05/18/2020 77  % Final  .  Neutro Abs 05/18/2020 4.9  1.7 - 7.7 K/uL Final  . Lymphocytes Relative 05/18/2020 10  % Final  . Lymphs Abs 05/18/2020 0.6* 0.7 - 4.0 K/uL Final  . Monocytes Relative 05/18/2020 10  % Final  . Monocytes Absolute 05/18/2020 0.6  0.1 - 1.0 K/uL Final  . Eosinophils Relative 05/18/2020 1  % Final  . Eosinophils Absolute  05/18/2020 0.1  0.0 - 0.5 K/uL Final  . Basophils Relative 05/18/2020 1  % Final  . Basophils Absolute 05/18/2020 0.1  0.0 - 0.1 K/uL Final  . Immature Granulocytes 05/18/2020 1  % Final  . Abs Immature Granulocytes 05/18/2020 0.06  0.00 - 0.07 K/uL Final   Performed at [facility], [address], [place], [place] [address]    Assessment:  Drew Gardner is a 75 y.o. male with essential thrombocytosison hydroxyurea (began 08/2014). CALR mutation was +.  Platelet count was 1,028,000 on 07/01/2014. Bone marrow biopsyin 08/2015revealed a myeloproliferative disorder. Lumbar spine MRIon 10/04/2013 revealed extensive marrow signal abnormality worrisome for a marrow infiltrative process.  He is on hydroxyurea1000 mg 5 days/week and 500 mg 2 days/week (Mondays and Fridays).  He has B12 deficiencyand pernicious anemia.  Intrinsic factor antibody was positive in 08/2019.  He is on oral B12. B12 was 894 on 07/24/2014, 801 on 04/09/2015, and 887 on 04/23/2019.  Folate was 10.1 on 04/23/2019 and 6.9 on 10/22/2019.  He has evidence of hemolysis.  Haptoglobin was <10 on 04/23/2019.  Total bilirubin was 3.2 (direct 0.2). Rect was 10.3%.  LDH was 195 (98-192). Coombs was negative.  Peripheral smear revealed macrocytosis with circulating nRBCs. There were large platelets. There was no evidence of circulating blasts.   He has hemolysis of unclear etiology.  He was jaundiced at birth.  He was on folic acid years ago when he was younger.  At times, he noticed his urine was dark.  He underwent cholecystectomy for gallstones at age 37.  He is unaware of a family history of any blood disorders.  PNH by flow cytometry was negative on 10/29/2012.  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  He initially underwent frequent phlebotomy, but has not in some time.  Hemochromatosis assay on 05/30/2013 revealed a single mutation (H63D).  Hemoglobin electrophoresis revealed an elevated fetal hemoglobin- Hgb  A 88.9 and HgbF 8.7% (high).   Abdominal ultrasound on 05/08/2019 revealed 3 known cysts, the largest contains a septation and measures 5.3 x 3.1 x 3.3 cm in the right lobe (previously 3.0 x 2.5 x 3.1 cm). A smaller septated cyst in the right lobe measures 1.3 x 1.2 x 1.2 cm (previously 1.5 x 1.2 x 1.4 cm), while a cyst in the left lobe measures 2.2 x 1.7 x 1.5 cm (previously 1.9 x 1.2 x 1.9 cm). There was persistent mild splenomegaly.  There was a right pleural effusion.  AFP was 2.2on 05/03/2019.  Ferritin has been followed: 248 on 10/25/2012, 251 on 01/17/2013, 400 on 04/11/2013, 373 on 07/24/2014, 313 on 04/09/2015, 316 on 04/24/2018 and 172 on 04/23/2019.  He has a history of testicular cancers/p orchiectomy and radiation in 1977. He has Gilbert's disease.  He has a history ofmalignant melanomaon the lower back s/p biopsy on 03/05/2018 followed by wide excision on 03/16/2018. Clinical stagewas T1bN0Mx (stage IA). Lesion was 0.8 mm thick. He is followed at [facility].  He has Barrett's esophagus.  He received the COVID-19 vaccine (second dose on 05/09/2019). He received the [employer] on 12/11/2019.  Symptomatically, he feels  fine. He has been a bit short of breath with exertion. Weight is up 5 lbs. His legs are swollen and he tries keep them elevated at night. His urine is still dark in the mornings. Exam is stable.  Plan: 1.   Labs today: CBC with diff, CMP, retic, LDH. 2.   Essential thrombocytosis CALR mutation is positive.  Clinically, he remains stable.  Hematocrit 33.3.  Hemoglobin 12.2.  MCV 129.6.  Platelets 150,000 on 10/29/2019.   Hematocrit 36.4.  Hemoglobin 13.6.  MCV 125.5.  Platelets 257,000 on 11/19/2019.  Hematocrit 36.9.  Hemoglobin 13.7.  MCV 118.3.  Platelets 301,000 on 02/18/2020.  Hematocrit 34.6.  Hemoglobin 12.4.  MCV 118.1.  Platelets 171,000 on 05/18/2020.  Continue hydroxyurea 1000 mg 5 days/week and 500 mg 2 days/week (Mondays and  Fridays). Macrocytosis is secondary to hydroxyurea  Last bone marrow was performed in 10/2013.  Continue to monitor. 3.B12 deficiency He has pernicious anemia. Intrinsic factor antibody was positive in 08/2019.   He continues oral B12. B12 was 456 on 10/22/2019.    Folate 10.1 on 11/19/2019.  Check folate annually.  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 4.   Hemolysis  Patient has mild anemia but elevated retic, elevated indirect bilirubin, and slightly elevated LDH.  Peripheral smear revealed macrocytosis with circulating nRBCs. There were large platelets. There was no evidence of circulating blasts.   He has had 3 CBCs with an interfering substance precluding measurement of hemogloblin.  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  Prior laboratory work-up:    LDH normal dating back to 2014 until 04/23/2019 (195).   Bilirubin 2.4-4.1 (direct 0.1-0.2).   Retic 5.4%-10.3% last 2 years.   PNH testing negative on 10/29/2012.   Coombs was negative on 04/23/2019 and 10/22/2019.   Cold agglutinins were negative on 04/23/2019.  G6PD and pyruvate kinase were negative on 05/03/2019.   Patient denies any family history of a known hemoglobinopathy or RBC membrane defect.    He is s/p cholecystectomy at a young age.   He has had dark urine in the mornings in his early 22s.  He notes chronic jaundice.  He has aortic insufficiency - sheer across valves.  There are rare instances of hydroxyurea induced hemolysis   "Hydroxyurea induced hemolytic anemia in a patient with essential thromboctytopenia" in American Journal of Hematology 2004, 77: 374-376.   Hemolysis appears to have preceded initiation of hydroxyurea.  Assist with second opinion at [facility] to determine the etiology of his hemolysis. 5.   Hemochromatosis (carrier)  He has a history of frequent phlebotomies  (none in years).  Ferritin has ranged between 300 - 400.  Ferritin was 135 with an iron saturation of 82% and a TIBC of 237 on 10/22/2019.   Patient notes colonoscopy 2 years ago (no report available) negative.   EGD 10/06/2017 revealed Barrett's without evidence of GI blood loss.   Urinalysis on 04/23/2019 revealed no hematuria.  Hemochromatosis testing confirmed patient is a carrier (single mutation H63D).   No need for phlebotomies.  AFP was normal on 05/03/2019.  Abdominal ultrasound on 05/08/2019 revealed enlargement of 2 hepatic cysts (largest 5.3 cm) and stable splenomegaly. 6.   [facility] referral. 7.   Patient moving to [place]. 8.   Front desk: ROI for new physician. 9.   RTC prn.  I discussed the assessment and treatment plan with the patient.  The patient was provided an opportunity to ask questions and all were answered.  The patient agreed with the plan and demonstrated an understanding of the instructions.  The patient was advised to call back if the symptoms worsen or if the condition fails to improve as anticipated.   Lequita Asal, MD, PhD 05/18/2020, 9:53 AM  I, Mirian Mo Tufford, am acting as a Education administrator for [employer]. Mike Gip, MD.   I, Melynda Krzywicki C. Mike Gip, MD, have reviewed the above documentation for accuracy and completeness, and I agree with the above.

## 2020-05-18 ENCOUNTER — Telehealth: Payer: Self-pay

## 2020-05-18 ENCOUNTER — Inpatient Hospital Stay (HOSPITAL_BASED_OUTPATIENT_CLINIC_OR_DEPARTMENT_OTHER): Payer: Medicare Other | Admitting: Hematology and Oncology

## 2020-05-18 ENCOUNTER — Telehealth: Payer: Self-pay | Admitting: *Deleted

## 2020-05-18 ENCOUNTER — Other Ambulatory Visit: Payer: Self-pay

## 2020-05-18 ENCOUNTER — Inpatient Hospital Stay: Payer: Medicare Other | Attending: Hematology and Oncology

## 2020-05-18 ENCOUNTER — Encounter: Payer: Self-pay | Admitting: Hematology and Oncology

## 2020-05-18 VITALS — BP 158/65 | HR 76 | Temp 97.2°F | Resp 18 | Wt 165.0 lb

## 2020-05-18 DIAGNOSIS — R778 Other specified abnormalities of plasma proteins: Secondary | ICD-10-CM

## 2020-05-18 DIAGNOSIS — Z79899 Other long term (current) drug therapy: Secondary | ICD-10-CM | POA: Insufficient documentation

## 2020-05-18 DIAGNOSIS — D7589 Other specified diseases of blood and blood-forming organs: Secondary | ICD-10-CM | POA: Diagnosis not present

## 2020-05-18 DIAGNOSIS — D510 Vitamin B12 deficiency anemia due to intrinsic factor deficiency: Secondary | ICD-10-CM | POA: Insufficient documentation

## 2020-05-18 DIAGNOSIS — D75839 Thrombocytosis, unspecified: Secondary | ICD-10-CM | POA: Insufficient documentation

## 2020-05-18 DIAGNOSIS — D473 Essential (hemorrhagic) thrombocythemia: Secondary | ICD-10-CM | POA: Diagnosis not present

## 2020-05-18 DIAGNOSIS — R059 Cough, unspecified: Secondary | ICD-10-CM | POA: Diagnosis not present

## 2020-05-18 DIAGNOSIS — E538 Deficiency of other specified B group vitamins: Secondary | ICD-10-CM | POA: Diagnosis not present

## 2020-05-18 LAB — COMPREHENSIVE METABOLIC PANEL
ALT: 12 U/L (ref 0–44)
AST: 15 U/L (ref 15–41)
Albumin: 4 g/dL (ref 3.5–5.0)
Alkaline Phosphatase: 52 U/L (ref 38–126)
Anion gap: 8 (ref 5–15)
BUN: 13 mg/dL (ref 8–23)
CO2: 29 mmol/L (ref 22–32)
Calcium: 9.4 mg/dL (ref 8.9–10.3)
Chloride: 100 mmol/L (ref 98–111)
Creatinine, Ser: 0.72 mg/dL (ref 0.61–1.24)
GFR, Estimated: >60 mL/min (ref >60)
Glucose, Bld: 117 mg/dL — ABNORMAL HIGH (ref 70–99)
Potassium: 3.9 mmol/L (ref 3.5–5.1)
Sodium: 137 mmol/L (ref 135–145)
Total Bilirubin: 3.6 mg/dL — ABNORMAL HIGH (ref 0.3–1.2)
Total Protein: 6.9 g/dL (ref 6.5–8.1)

## 2020-05-18 LAB — CBC WITH DIFFERENTIAL/PLATELET
Abs Immature Granulocytes: 0.06 10*3/uL (ref 0.00–0.07)
Basophils Absolute: 0.1 10*3/uL (ref 0.0–0.1)
Basophils Relative: 1 %
Eosinophils Absolute: 0.1 10*3/uL (ref 0.0–0.5)
Eosinophils Relative: 1 %
HCT: 34.6 % — ABNORMAL LOW (ref 39.0–52.0)
Hemoglobin: 12.4 g/dL — ABNORMAL LOW (ref 13.0–17.0)
Immature Granulocytes: 1 %
Lymphocytes Relative: 10 %
Lymphs Abs: 0.6 10*3/uL — ABNORMAL LOW (ref 0.7–4.0)
MCH: 42.3 pg — ABNORMAL HIGH (ref 26.0–34.0)
MCHC: 35.8 g/dL (ref 30.0–36.0)
MCV: 118.1 fL — ABNORMAL HIGH (ref 80.0–100.0)
Monocytes Absolute: 0.6 10*3/uL (ref 0.1–1.0)
Monocytes Relative: 10 %
Neutro Abs: 4.9 10*3/uL (ref 1.7–7.7)
Neutrophils Relative %: 77 %
Platelets: 371 10*3/uL (ref 150–400)
RBC: 2.93 MIL/uL — ABNORMAL LOW (ref 4.22–5.81)
RDW: 17.2 % — ABNORMAL HIGH (ref 11.5–15.5)
WBC: 6.4 10*3/uL (ref 4.0–10.5)
nRBC: 0 % (ref 0.0–0.2)

## 2020-05-18 LAB — RETICULOCYTES
Immature Retic Fract: 33.4 % — ABNORMAL HIGH (ref 2.3–15.9)
RBC.: 2.83 MIL/uL — ABNORMAL LOW (ref 4.22–5.81)
Retic Count, Absolute: 298.8 10*3/uL — ABNORMAL HIGH (ref 19.0–186.0)
Retic Ct Pct: 10.6 % — ABNORMAL HIGH (ref 0.4–3.1)

## 2020-05-18 LAB — LACTATE DEHYDROGENASE: LDH: 169 U/L (ref 98–192)

## 2020-05-18 MED ORDER — HYDROXYUREA 500 MG PO CAPS: ORAL_CAPSULE | ORAL | 0 refills | Status: DC

## 2020-05-18 MED ORDER — HYDROXYUREA 500 MG PO CAPS: ORAL_CAPSULE | ORAL | 0 refills | Status: AC

## 2020-05-18 NOTE — Telephone Encounter (Signed)
Pharmacy called asking for directions for the [facility] prescription sent in Please advise

## 2020-05-18 NOTE — Progress Notes (Signed)
Patient here for oncology follow-up appointment, expresses concerns of shortness of breath with exertion. Patient states he is moving; lists of providers given to patient

## 2020-07-16 ENCOUNTER — Other Ambulatory Visit
Admission: RE | Admit: 2020-07-16 | Discharge: 2020-07-16 | Disposition: A | Discharge status: Home / Self Care | Payer: Medicare Other | Source: Ambulatory Visit | Attending: Cardiology | Admitting: Cardiology

## 2020-07-16 DIAGNOSIS — I10 Essential (primary) hypertension: Secondary | ICD-10-CM | POA: Diagnosis present

## 2020-07-16 DIAGNOSIS — R011 Cardiac murmur, unspecified: Secondary | ICD-10-CM | POA: Diagnosis present

## 2020-07-16 LAB — BRAIN NATRIURETIC PEPTIDE: B Natriuretic Peptide: 33.4 pg/mL (ref 0.0–100.0)

## 2020-10-29 IMAGING — US US THORACENTESIS ASP PLEURAL SPACE W/IMG GUIDE
1 series · 6 of 6 positions shown · non-contrast
Comparison: none

INDICATION: Patient with bilateral pleural effusions found incidentally on
recent MRI. Request is made for diagnostic and therapeutic
thoracentesis.

[Series 1: us thoracentesis asp pleural space w/img guide · 0.25mm/px · 6 of 6 slices shown]
[im 1/6]
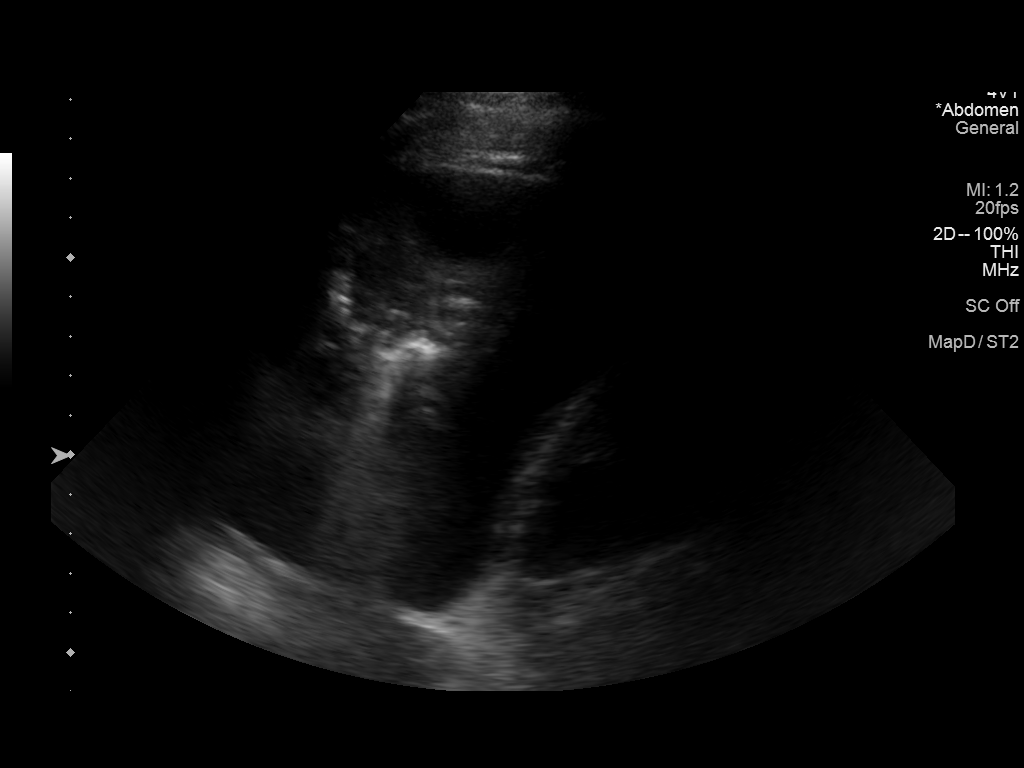
[im 2/6]
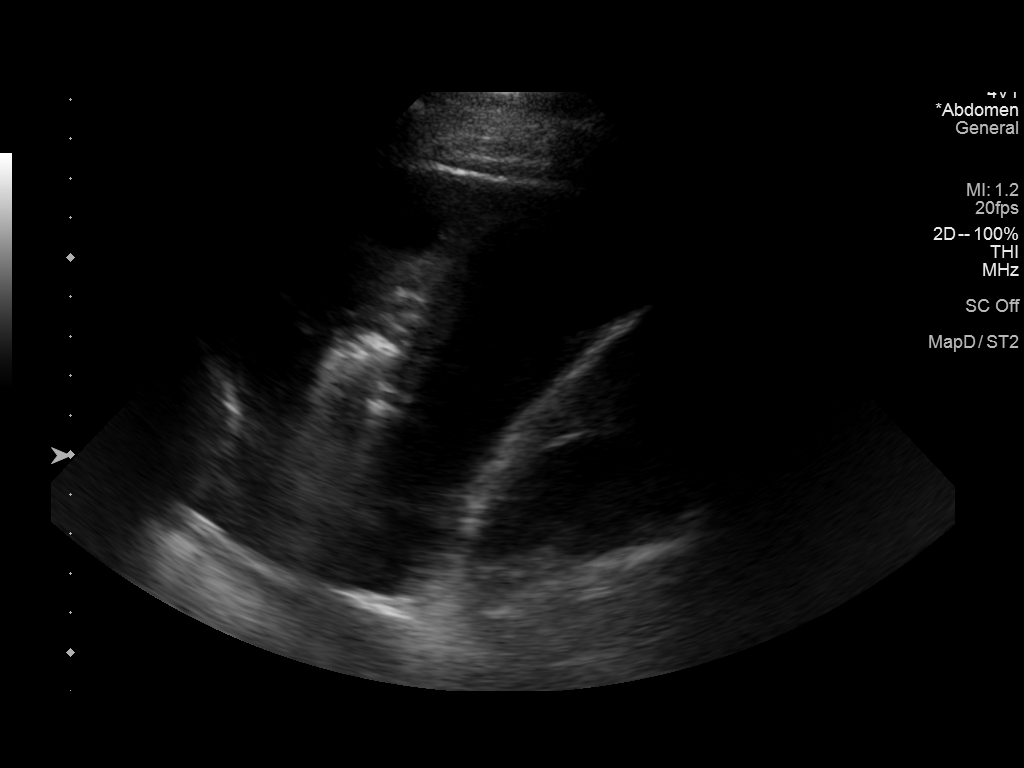
[im 3/6]
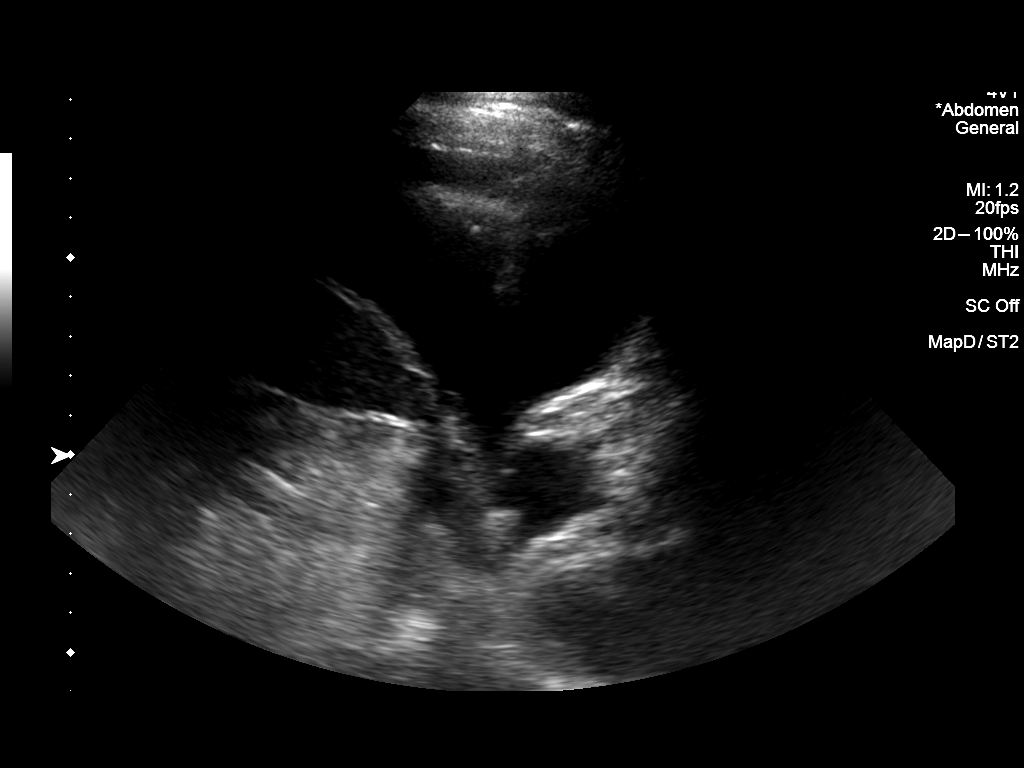
[im 4/6]
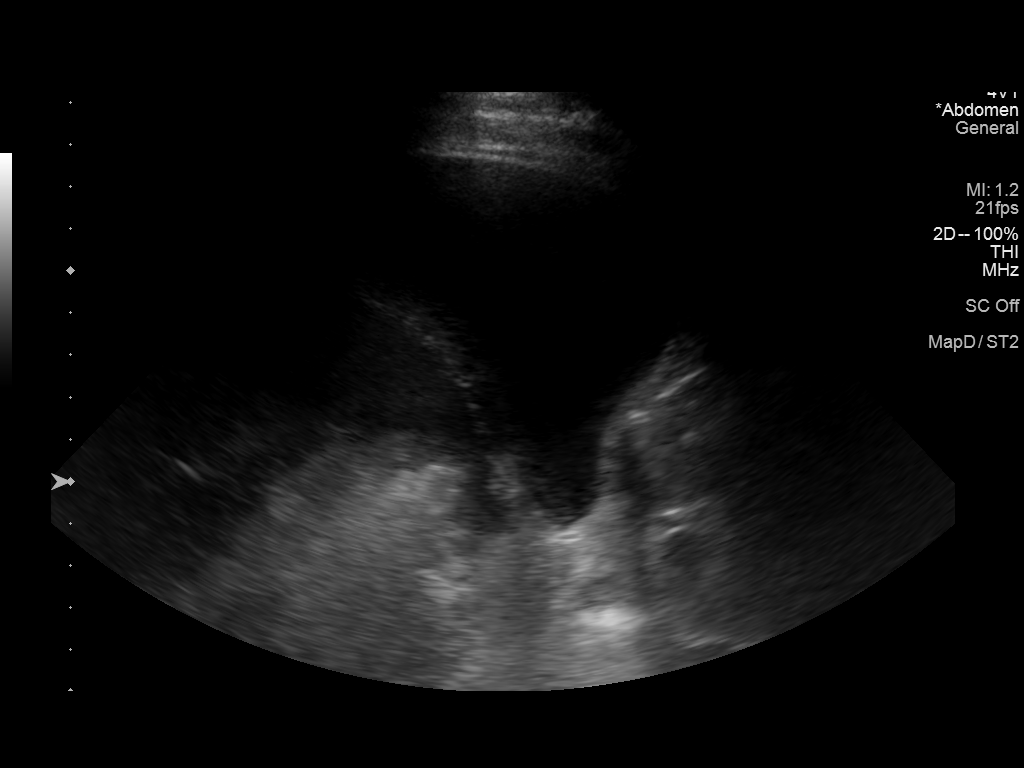
[im 5/6]
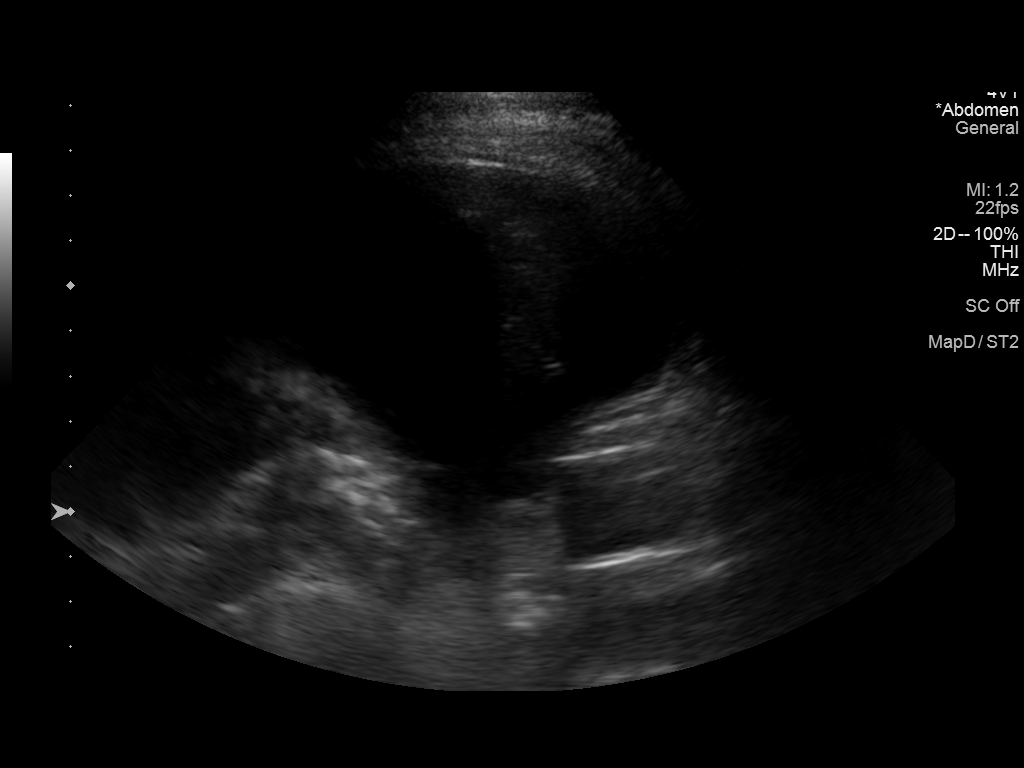
[im 6/6]
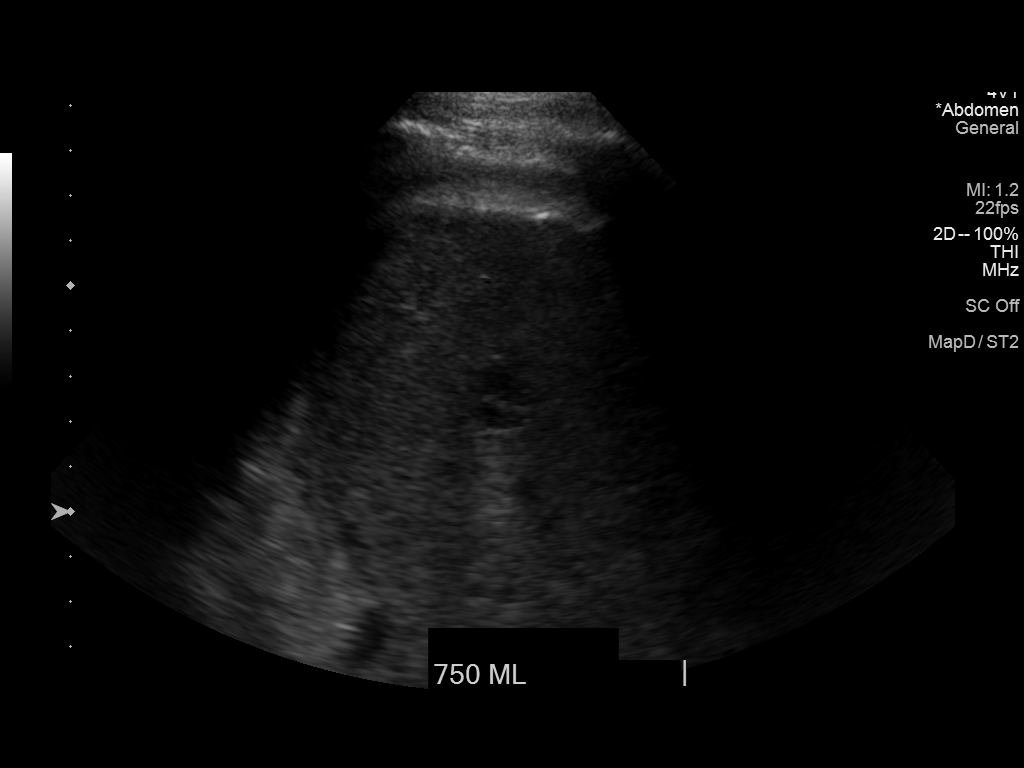

[6 of 6 positions shown; findings below may reference images not displayed]

EXAM:
ULTRASOUND GUIDED DIAGNOSTIC AND THERAPEUTIC RIGHT THORACENTESIS

MEDICATIONS:
10 mL 1% lidocaine

COMPLICATIONS:
None immediate.

PROCEDURE:
An ultrasound guided thoracentesis was thoroughly discussed with the
patient and questions answered. The benefits, risks, alternatives
and complications were also discussed. The patient understands and
wishes to proceed with the procedure. Written consent was obtained.

Ultrasound was performed to localize and mark an adequate pocket of
fluid in the right chest. The area was then prepped and draped in
the normal sterile fashion. 1% Lidocaine was used for local
anesthesia. Under ultrasound guidance a 8 Fr Safe-T-Centesis
catheter was introduced. Thoracentesis was performed. The catheter
was removed and a dressing applied.
FINDINGS: A total of approximately 750 mL of yellow fluid was removed. Samples
were sent to the laboratory as requested by the clinical team.
IMPRESSION: Successful ultrasound guided diagnostic and therapeutic right
thoracentesis yielding 750 mL of pleural fluid.

## 2021-04-23 NOTE — Telephone Encounter (Signed)
Signing encounter, See previous note 3/14

## 2021-10-29 IMAGING — US US ABDOMEN COMPLETE
1 series · 12 of 25 positions shown · non-contrast
Comparison: 01/22/2013
COMPARISON: 01/22/2013

Addendum:
CLINICAL DATA: Hereditary hemochromatosis. Essential
thrombocytosis.

EXAM:
ABDOMEN ULTRASOUND COMPLETE

[Series 1: us abdomen complete · 0.26mm/px · 12 of 123 slices shown]
[im 6/123]
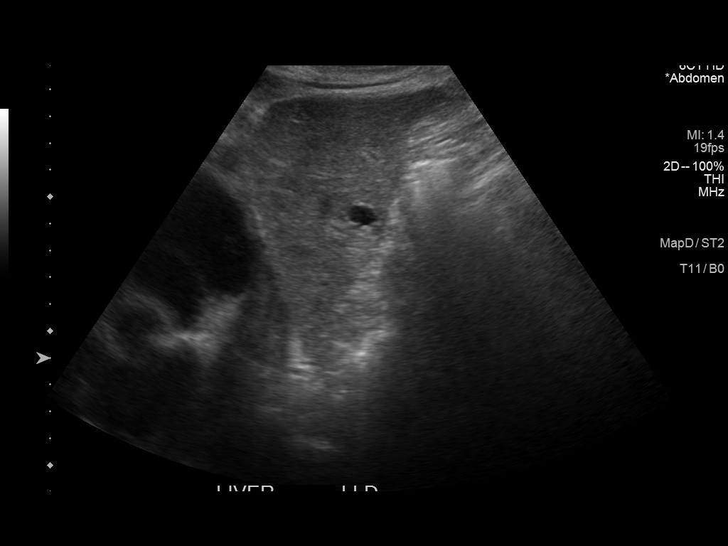
[im 16/123]
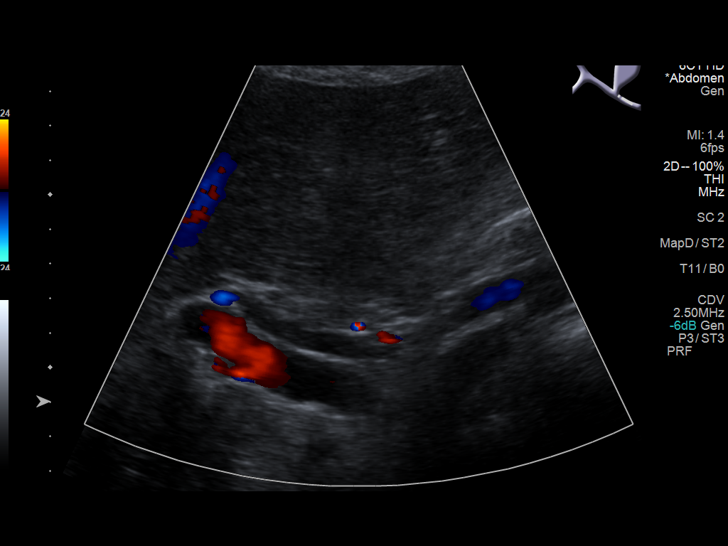
[im 26/123]
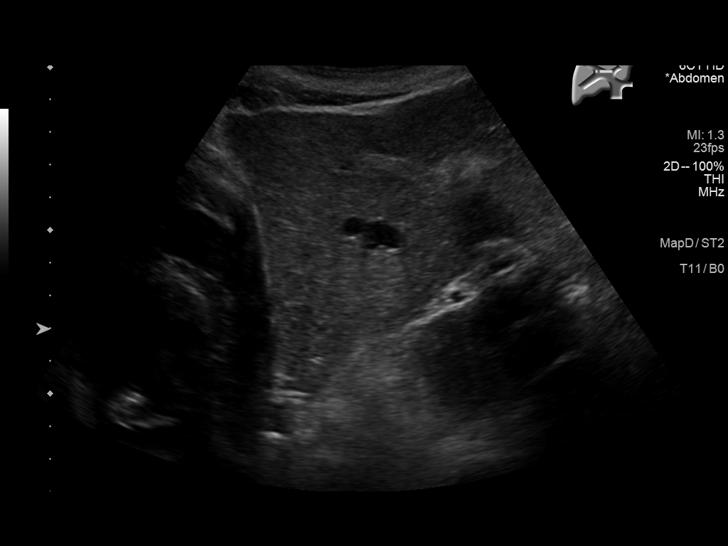
[im 36/123]
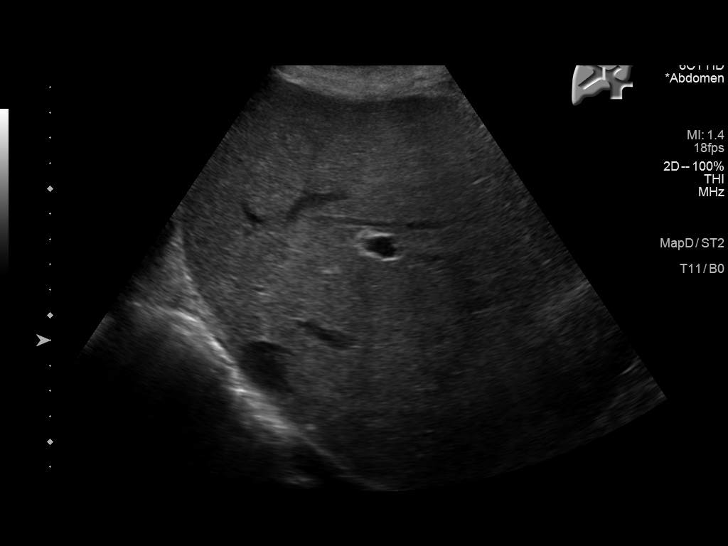
[im 46/123]
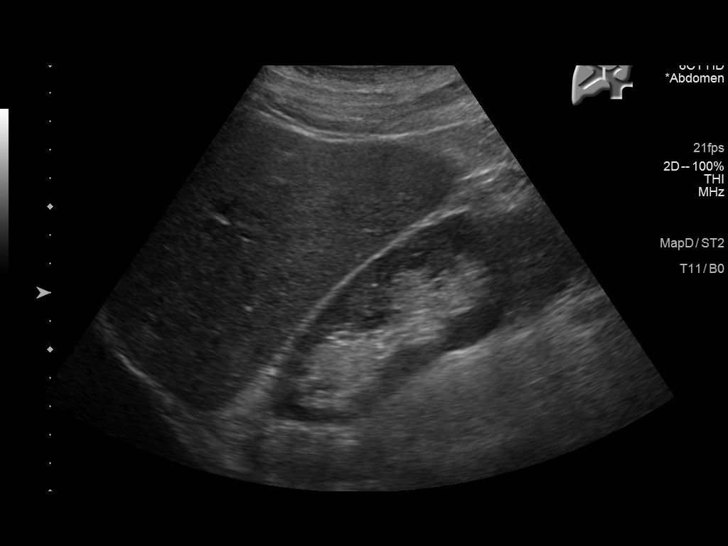
[im 56/123]
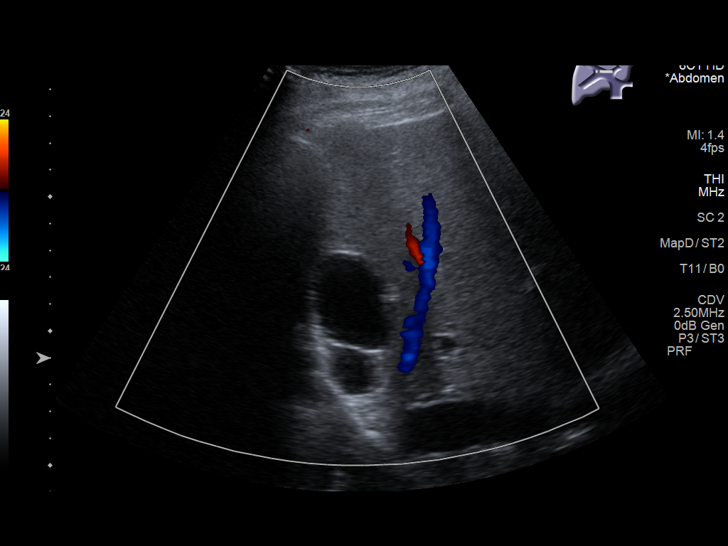
[im 67/123]
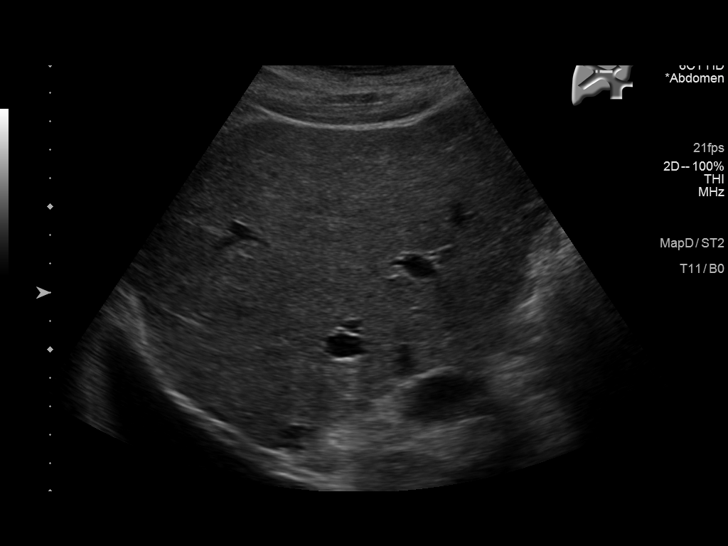
[im 77/123]
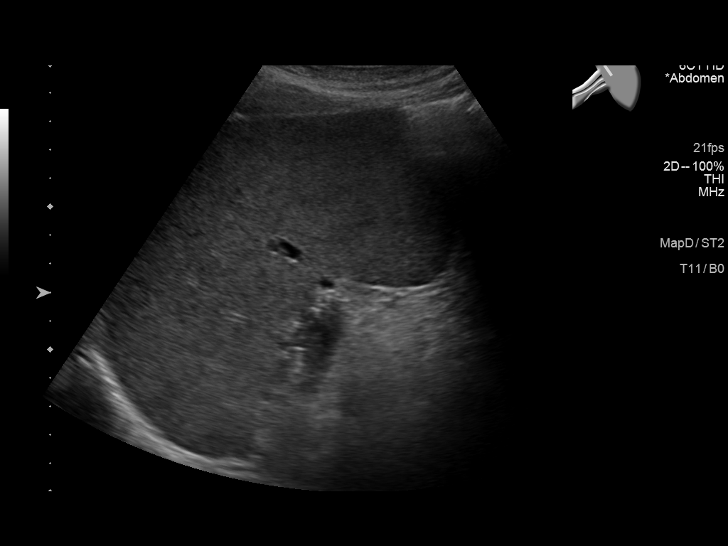
[im 87/123]
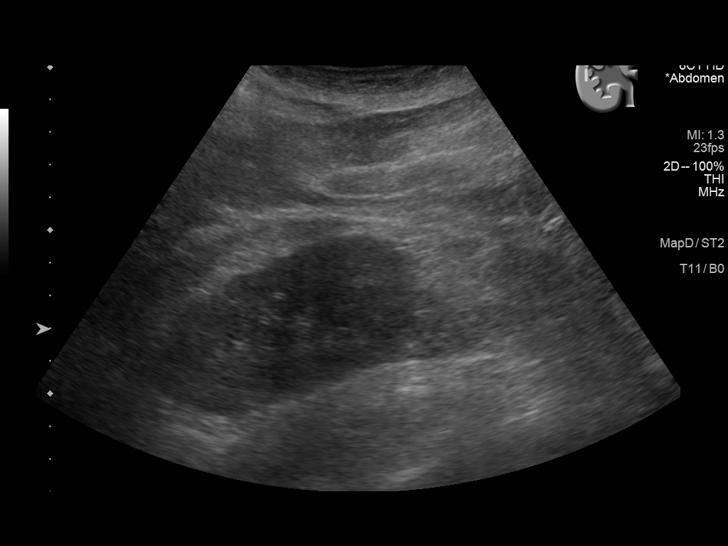
[im 97/123]
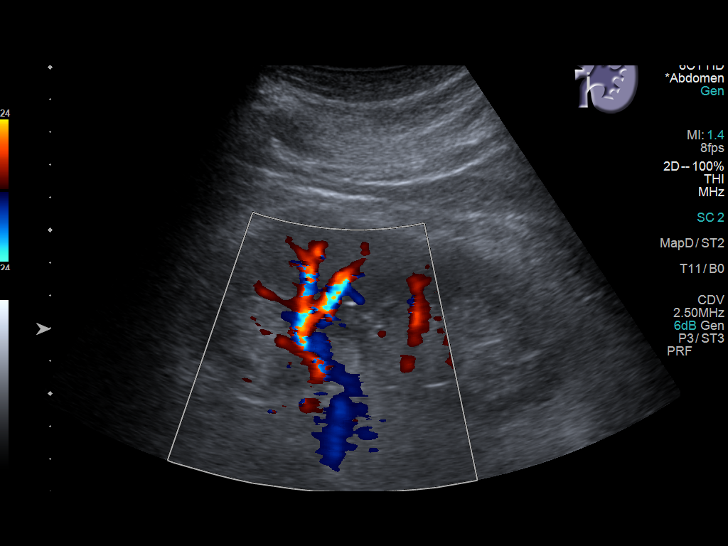
[im 107/123]
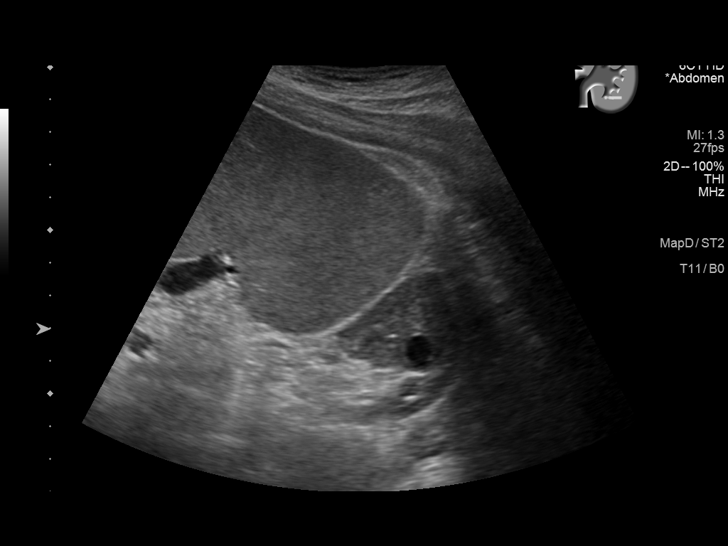
[im 117/123]
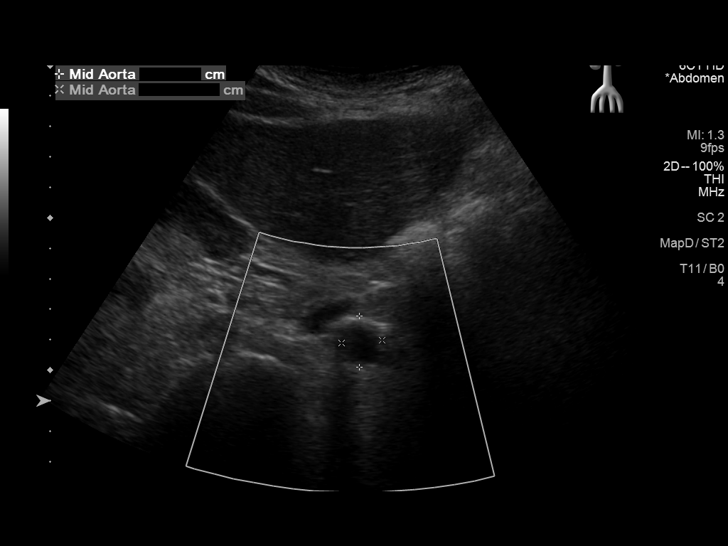

[12 of 25 positions shown; findings below may reference images not displayed]

FINDINGS: Gallbladder: Surgically absent.

Common bile duct: Diameter: 8 mm

Liver: Background liver parenchymal echogenicity is normal, and no
solid masses identified. 3 cysts are again noted, the largest
contains a septation and measures 5.3 x 3.1 x 3.3 cm in the right
lobe (previously 3.0 x 2.5 x 3.1 cm). A smaller septated cyst in the
right lobe measures 1.3 x 1.2 x 1.2 cm (previously 1.5 x 1.2 x
cm), while a cyst in the left lobe measures 2.2 x 1.7 x 1.5 cm
(previously 1.9 x 1.2 x 1.9 cm). Portal vein is patent on color
Doppler imaging with normal direction of blood flow towards the
liver.

IVC: No abnormality visualized.

Pancreas: Visualized portion unremarkable.

Spleen: Persistent mild splenomegaly, subjectively unchanged
although with difficulty completely visualizing the spleen in the
longitudinal plane limiting accurate measurement of length and
estimated volume (with the best efforts of length and volume
measurement made by the technologist likely reflecting
underestimates).

Right Kidney: Length: 11.7 cm. Cortical thinning. Echogenicity
within normal limits. No mass or hydronephrosis visualized.

Left Kidney: Length: 12.0 cm. Cortical thinning. Echogenicity within
normal limits. No solid mass or hydronephrosis visualized. 1.1 cm
cyst.

Abdominal aorta: No aneurysm visualized.

Other findings: None.
IMPRESSION: 1. Interval enlargement of 2 hepatic cysts with the largest
measuring 5.3 cm in the right lobe.
2. Subjectively unchanged splenomegaly.

ADDENDUM:
Right pleural effusion.

*** End of Addendum ***
FINDINGS: Gallbladder: Surgically absent.

Common bile duct: Diameter: 8 mm

Liver: Background liver parenchymal echogenicity is normal, and no
solid masses identified. 3 cysts are again noted, the largest
contains a septation and measures 5.3 x 3.1 x 3.3 cm in the right
lobe (previously 3.0 x 2.5 x 3.1 cm). A smaller septated cyst in the
right lobe measures 1.3 x 1.2 x 1.2 cm (previously 1.5 x 1.2 x
cm), while a cyst in the left lobe measures 2.2 x 1.7 x 1.5 cm
(previously 1.9 x 1.2 x 1.9 cm). Portal vein is patent on color
Doppler imaging with normal direction of blood flow towards the
liver.

IVC: No abnormality visualized.

Pancreas: Visualized portion unremarkable.

Spleen: Persistent mild splenomegaly, subjectively unchanged
although with difficulty completely visualizing the spleen in the
longitudinal plane limiting accurate measurement of length and
estimated volume (with the best efforts of length and volume
measurement made by the technologist likely reflecting
underestimates).

Right Kidney: Length: 11.7 cm. Cortical thinning. Echogenicity
within normal limits. No mass or hydronephrosis visualized.

Left Kidney: Length: 12.0 cm. Cortical thinning. Echogenicity within
normal limits. No solid mass or hydronephrosis visualized. 1.1 cm
cyst.

Abdominal aorta: No aneurysm visualized.

Other findings: None.
IMPRESSION: 1. Interval enlargement of 2 hepatic cysts with the largest
measuring 5.3 cm in the right lobe.
2. Subjectively unchanged splenomegaly.
